# Patient Record
Sex: Male | Born: 1995 | Race: White | Hispanic: No | Marital: Single | Smoking: Never smoker
Health system: Southern US, Community
[De-identification: ages and names within clinical notes are randomized; demographics above are authoritative.]

## PROBLEM LIST (undated history)

## (undated) VITALS — BP 131/78 | HR 97 | Temp 98.2°F | Resp 16 | Ht 71.26 in | Wt 202.8 lb

## (undated) DIAGNOSIS — E669 Obesity, unspecified: Secondary | ICD-10-CM

## (undated) DIAGNOSIS — F329 Major depressive disorder, single episode, unspecified: Secondary | ICD-10-CM

## (undated) DIAGNOSIS — F32A Depression, unspecified: Secondary | ICD-10-CM

## (undated) DIAGNOSIS — R45851 Suicidal ideations: Secondary | ICD-10-CM

## (undated) DIAGNOSIS — E119 Type 2 diabetes mellitus without complications: Secondary | ICD-10-CM

## (undated) DIAGNOSIS — F419 Anxiety disorder, unspecified: Secondary | ICD-10-CM

## (undated) DIAGNOSIS — R51 Headache: Secondary | ICD-10-CM

## (undated) HISTORY — PX: NO PAST SURGERIES: SHX2092

---
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
## 2018-03-22 ENCOUNTER — Other Ambulatory Visit: Payer: Self-pay

## 2018-03-22 ENCOUNTER — Emergency Department (HOSPITAL_COMMUNITY)
Admission: EM | Admit: 2018-03-22 | Discharge: 2018-03-22 | Disposition: A | Discharge status: Home / Self Care | Payer: Medicaid Other | Attending: Emergency Medicine | Admitting: Emergency Medicine

## 2018-03-22 ENCOUNTER — Encounter (HOSPITAL_COMMUNITY): Payer: Self-pay

## 2018-03-22 DIAGNOSIS — R05 Cough: Secondary | ICD-10-CM | POA: Diagnosis present

## 2018-03-22 DIAGNOSIS — E119 Type 2 diabetes mellitus without complications: Secondary | ICD-10-CM | POA: Insufficient documentation

## 2018-03-22 DIAGNOSIS — J069 Acute upper respiratory infection, unspecified: Secondary | ICD-10-CM | POA: Diagnosis not present

## 2018-03-22 DIAGNOSIS — Z79899 Other long term (current) drug therapy: Secondary | ICD-10-CM | POA: Insufficient documentation

## 2018-03-22 DIAGNOSIS — B9789 Other viral agents as the cause of diseases classified elsewhere: Secondary | ICD-10-CM | POA: Insufficient documentation

## 2018-03-22 NOTE — ED Provider Notes (Signed)
[facility]   CSN: [id number] Arrival date & time: 03/22/18  0602    History   Chief Complaint Chief Complaint  Patient presents with  . Cough    HPI Logan Harper is a 23 y.o. male.     The history is provided by the patient.  Cough  Cough characteristics:  Non-productive Severity:  Moderate Onset quality:  Gradual Timing:  Intermittent Progression:  Worsening Chronicity:  New Smoker: no   Relieved by:  Nothing Worsened by:  Nothing Associated symptoms: sinus congestion   Associated symptoms: no chest pain and no fever   PT reports onset of cough and congestion several hours ago.  No fever. No recent travel.  He is a non-smoker Patient reports his mother called ambulance for cough and shortness of breath, and that he began having cough and decided to also be transported by EMS Past Medical History:  Diagnosis Date  . Anxiety   . Depression   . Diabetes mellitus without complication (HCC)   . Headache(784.0)   . Obesity   . Suicidal ideation     Patient Active Problem List   Diagnosis Date Noted  . Conduct disorder, childhood-onset type, severe   . Suicidal ideation   . GAD (generalized anxiety disorder) 09/08/2012  . Fast heart beat 04/27/2012  . MDD (major depressive disorder), recurrent episode, moderate (HCC) 11/21/2011  . Conduct disorder, childhood-onset type 11/21/2011  . ADHD (attention deficit hyperactivity disorder), combined type 11/21/2011    Past Surgical History:  Procedure Laterality Date  . NO PAST SURGERIES          Home Medications    Prior to Admission medications   Medication Sig Start Date End Date Taking? Authorizing Provider  citalopram (CELEXA) 20 MG tablet Take 1 tablet (20 mg total) by mouth at bedtime. 12/08/13   Court Joy, PA-C  levocetirizine (XYZAL) 5 MG tablet Take 5 mg by mouth every evening.     [provider]  OLANZapine (ZYPREXA) 20 MG tablet  Take 1 tablet (20 mg total) by mouth at bedtime. 12/08/13   Court Joy, PA-C  propranolol (INDERAL) 20 MG tablet Take 2 tablets (40 mg total) by mouth 2 (two) times daily. 12/10/13   Court Joy, PA-C    Family History History reviewed. No pertinent family history.  Social History Social History   Tobacco Use  . Smoking status: Never Smoker  Substance Use Topics  . Alcohol use: No  . Drug use: No     Allergies   Patient has no known allergies.   Review of Systems Review of Systems  Constitutional: Negative for fever.  HENT: Positive for congestion.   Respiratory: Positive for cough.   Cardiovascular: Negative for chest pain.  All other systems reviewed and are negative.    Physical Exam Updated Vital Signs BP (!) 159/93 (BP Location: Left Arm)   Pulse (!) 111   Temp 98.5 F (36.9 C) (Oral)   Resp 18   Ht 1.905 m (6\' 3" )   Wt (!) 141.8 kg   SpO2 96%   BMI 39.07 kg/m   Physical Exam CONSTITUTIONAL: Well developed/well nourished using is phone no acute distress HEAD: Normocephalic/atraumatic EYES: EOMI/PERRL ENMT: Mucous membranes moist, uvula midline without erythema or exudate NECK: supple no meningeal signs SPINE/BACK:entire spine nontender CV: S1/S2 noted, no murmurs/rubs/gallops noted LUNGS: Lungs are clear to auscultation bilaterally, no apparent distress ABDOMEN: soft, nontender, no rebound or guarding, bowel sounds noted throughout  abdomen GU:no cva tenderness NEURO: Pt is awake/alert/appropriate, moves all extremitiesx4.  No facial droop.   EXTREMITIES: pulses normal/equal, full ROM, no lower extremity edema or tenderness SKIN: warm, color normal PSYCH: no abnormalities of mood noted, alert and oriented to situation   ED Treatments / Results  Labs (all labs ordered are listed, but only abnormal results are displayed) Labs Reviewed - No data to display  EKG None  Radiology No results found.  Procedures Procedures    Medications  Ordered in ED Medications - No data to display   Initial Impression / Assessment and Plan / ED Course  I have reviewed the triage vital signs and the nursing notes.      Patient reports cough without fever.  Lung sounds are clear.  Will discharge home No indication for imaging or further workup  Final Clinical Impressions(s) / ED Diagnoses   Final diagnoses:  Viral URI with cough    ED Discharge Orders    None       Zadie Rhine, MD 03/22/18 [phone]

## 2018-03-22 NOTE — ED Triage Notes (Signed)
Pt reports cough and chest congestion. Denies fever or travel. Hx DM and anxiety. Ambulatory in no distress.

## 2018-05-11 ENCOUNTER — Encounter (HOSPITAL_COMMUNITY): Payer: Self-pay

## 2018-05-11 ENCOUNTER — Other Ambulatory Visit: Payer: Self-pay

## 2018-05-11 ENCOUNTER — Emergency Department (HOSPITAL_COMMUNITY)
Admission: EM | Admit: 2018-05-11 | Discharge: 2018-05-12 | Disposition: A | Discharge status: Other Acute Inpatient Hospital | Payer: Medicaid Other | Attending: Emergency Medicine | Admitting: Emergency Medicine

## 2018-05-11 DIAGNOSIS — R45851 Suicidal ideations: Secondary | ICD-10-CM | POA: Insufficient documentation

## 2018-05-11 DIAGNOSIS — Z79899 Other long term (current) drug therapy: Secondary | ICD-10-CM | POA: Insufficient documentation

## 2018-05-11 DIAGNOSIS — F29 Unspecified psychosis not due to a substance or known physiological condition: Secondary | ICD-10-CM | POA: Insufficient documentation

## 2018-05-11 DIAGNOSIS — Z03818 Encounter for observation for suspected exposure to other biological agents ruled out: Secondary | ICD-10-CM | POA: Insufficient documentation

## 2018-05-11 LAB — CBC WITH DIFFERENTIAL/PLATELET
Abs Immature Granulocytes: 0.02 10*3/uL (ref 0.00–0.07)
Basophils Absolute: 0 10*3/uL (ref 0.0–0.1)
Basophils Relative: 0 %
Eosinophils Absolute: 0.1 10*3/uL (ref 0.0–0.5)
Eosinophils Relative: 1 %
HCT: 43.5 % (ref 39.0–52.0)
Hemoglobin: 14.4 g/dL (ref 13.0–17.0)
Immature Granulocytes: 0 %
Lymphocytes Relative: 26 %
Lymphs Abs: 2.3 10*3/uL (ref 0.7–4.0)
MCH: 29.1 pg (ref 26.0–34.0)
MCHC: 33.1 g/dL (ref 30.0–36.0)
MCV: 87.9 fL (ref 80.0–100.0)
Monocytes Absolute: 0.8 10*3/uL (ref 0.1–1.0)
Monocytes Relative: 9 %
Neutro Abs: 5.8 10*3/uL (ref 1.7–7.7)
Neutrophils Relative %: 64 %
Platelets: 298 10*3/uL (ref 150–400)
RBC: 4.95 MIL/uL (ref 4.22–5.81)
RDW: 13.5 % (ref 11.5–15.5)
WBC: 9 10*3/uL (ref 4.0–10.5)
nRBC: 0 % (ref 0.0–0.2)

## 2018-05-11 LAB — COMPREHENSIVE METABOLIC PANEL
ALT: 57 U/L — ABNORMAL HIGH (ref 0–44)
AST: 41 U/L (ref 15–41)
Albumin: 4.7 g/dL (ref 3.5–5.0)
Alkaline Phosphatase: 98 U/L (ref 38–126)
Anion gap: 13 (ref 5–15)
BUN: 5 mg/dL — ABNORMAL LOW (ref 6–20)
CO2: 22 mmol/L (ref 22–32)
Calcium: 9.8 mg/dL (ref 8.9–10.3)
Chloride: 101 mmol/L (ref 98–111)
Creatinine, Ser: 1.06 mg/dL (ref 0.61–1.24)
GFR calc Af Amer: >60 mL/min (ref >60)
GFR calc non Af Amer: >60 mL/min (ref >60)
Glucose, Bld: 125 mg/dL — ABNORMAL HIGH (ref 70–99)
Potassium: 3 mmol/L — ABNORMAL LOW (ref 3.5–5.1)
Sodium: 136 mmol/L (ref 135–145)
Total Bilirubin: 0.9 mg/dL (ref 0.3–1.2)
Total Protein: 8.3 g/dL — ABNORMAL HIGH (ref 6.5–8.1)

## 2018-05-11 LAB — ETHANOL: Alcohol, Ethyl (B): <10 mg/dL (ref <10)

## 2018-05-11 LAB — RAPID URINE DRUG SCREEN, HOSP PERFORMED
Amphetamines: NOT DETECTED
Barbiturates: NOT DETECTED
Benzodiazepines: POSITIVE — AB
Cocaine: NOT DETECTED
Opiates: NOT DETECTED
Tetrahydrocannabinol: NOT DETECTED

## 2018-05-11 LAB — ACETAMINOPHEN LEVEL: Acetaminophen (Tylenol), Serum: <10 ug/mL — ABNORMAL LOW (ref 10–30)

## 2018-05-11 LAB — SALICYLATE LEVEL: Salicylate Lvl: <7 mg/dL (ref 2.8–30.0)

## 2018-05-11 LAB — CBG MONITORING, ED: Glucose-Capillary: 106 mg/dL — ABNORMAL HIGH (ref 70–99)

## 2018-05-11 MED ORDER — LORAZEPAM 2 MG/ML IJ SOLN
1.0000 mg | Freq: Once | INTRAMUSCULAR | Status: AC
  Administered 2018-05-11: 10:00:00 1 mg via INTRAVENOUS
  Filled 2018-05-11: qty 1

## 2018-05-11 MED ORDER — ZIPRASIDONE MESYLATE 20 MG IM SOLR
20.0000 mg | Freq: Once | INTRAMUSCULAR | Status: AC
  Administered 2018-05-11: 10:00:00 20 mg via INTRAMUSCULAR
  Filled 2018-05-11: qty 20

## 2018-05-11 MED ORDER — LORATADINE 10 MG PO TABS: 10.0000 mg | ORAL_TABLET | Freq: Every evening | ORAL | Status: DC

## 2018-05-11 MED ORDER — LORAZEPAM 1 MG PO TABS
2.0000 mg | ORAL_TABLET | ORAL | Status: DC | PRN
  Administered 2018-05-11: 2 mg via ORAL
  Filled 2018-05-11: qty 2

## 2018-05-11 MED ORDER — POTASSIUM CHLORIDE CRYS ER 20 MEQ PO TBCR
20.0000 meq | EXTENDED_RELEASE_TABLET | Freq: Every day | ORAL | Status: DC
  Administered 2018-05-11 – 2018-05-12 (×2): 20 meq via ORAL
  Filled 2018-05-11 (×2): qty 1

## 2018-05-11 MED ORDER — MIDAZOLAM HCL 2 MG/2ML IJ SOLN
4.0000 mg | Freq: Once | INTRAMUSCULAR | Status: AC
  Administered 2018-05-11: 12:00:00 4 mg via INTRAVENOUS
  Filled 2018-05-11: qty 4

## 2018-05-11 MED ORDER — OLANZAPINE 5 MG PO TBDP
10.0000 mg | ORAL_TABLET | Freq: Every day | ORAL | Status: DC
  Administered 2018-05-11: 22:00:00 10 mg via ORAL
  Filled 2018-05-11 (×2): qty 2

## 2018-05-11 MED ORDER — ONDANSETRON 4 MG PO TBDP
4.0000 mg | ORAL_TABLET | Freq: Once | ORAL | Status: DC
  Filled 2018-05-11: qty 1

## 2018-05-11 MED ORDER — OLANZAPINE 10 MG IM SOLR
10.0000 mg | Freq: Once | INTRAMUSCULAR | Status: AC
  Administered 2018-05-11: 12:00:00 10 mg via INTRAMUSCULAR
  Filled 2018-05-11: qty 10

## 2018-05-11 MED ORDER — STERILE WATER FOR INJECTION IJ SOLN
INTRAMUSCULAR | Status: AC
  Administered 2018-05-11: 10:00:00
  Filled 2018-05-11: qty 10

## 2018-05-11 MED ORDER — PROPRANOLOL HCL 40 MG PO TABS
40.0000 mg | ORAL_TABLET | Freq: Two times a day (BID) | ORAL | Status: DC
  Administered 2018-05-12 (×2): 40 mg via ORAL
  Filled 2018-05-11 (×2): qty 1

## 2018-05-11 NOTE — ED Provider Notes (Signed)
[facility] Provider Note   CSN: [id number] Arrival date & time: 05/11/18  0909    History   Chief Complaint Chief Complaint  Patient presents with   Suicidal    HPI Logan Harper is a 23 y.o. male.     HPI   23 year old male with a past medical history of anxiety, depression, conduct disorder presents today via EMS for suicidal ideation.  Per EMS the police were called out as patient had suicidal ideations.  I spoke with his mother on the phone that reports that he has a history of aggressive behavior and was recently taken closet pain.  She notes that he had taken this for approximately 5 years and was weaned off of it recently.  She notes that since stopping clozapine his demeanor has changed, he is withdrawn, will not talk to her, will not eat.  She notes that he wanted her to call the police this morning as he was not feeling well.  She also notes that recently he started taking Latuda.  He has not eaten any of his medicines this morning.  She notes he has been in and out of oral health facilities for similar presentations.  She notes that he has been showing aggression since stopping clozapine.  Patient does not want to provide any significant information.  When asked directly if he wants to hurt himself he reports that he does, he reports that he wants to cut himself.  He also reports that he took medication this morning but is uncertain what medication he took.  He denies any physical complaints.  Past Medical History:  Diagnosis Date   Anxiety    Depression    Diabetes mellitus without complication (HCC)    Headache(784.0)    Obesity    Suicidal ideation     Patient Active Problem List   Diagnosis Date Noted   Conduct disorder, childhood-onset type, severe    Suicidal ideation    GAD (generalized anxiety disorder) 09/08/2012   Fast heart beat 04/27/2012   MDD (major depressive disorder), recurrent episode, moderate  (HCC) 11/21/2011   Conduct disorder, childhood-onset type 11/21/2011   ADHD (attention deficit hyperactivity disorder), combined type 11/21/2011    Past Surgical History:  Procedure Laterality Date   NO PAST SURGERIES          Home Medications    Prior to Admission medications   Medication Sig Start Date End Date Taking? Authorizing Provider  citalopram (CELEXA) 20 MG tablet Take 1 tablet (20 mg total) by mouth at bedtime. 12/08/13   Court JoyKober, Charles E, PA-C  levocetirizine (XYZAL) 5 MG tablet Take 5 mg by mouth every evening.     [provider]  OLANZapine (ZYPREXA) 20 MG tablet Take 1 tablet (20 mg total) by mouth at bedtime. 12/08/13   Court JoyKober, Charles E, PA-C  propranolol (INDERAL) 20 MG tablet Take 2 tablets (40 mg total) by mouth 2 (two) times daily. 12/10/13   Court JoyKober, Charles E, PA-C    Family History History reviewed. No pertinent family history.  Social History Social History   Tobacco Use   Smoking status: Never Smoker   Smokeless tobacco: Never Used  Substance Use Topics   Alcohol use: No   Drug use: No     Allergies   Patient has no known allergies.   Review of Systems Review of Systems  All other systems reviewed and are negative.    Physical Exam Updated Vital Signs BP 104/69  Pulse 82    Temp 98.3 F (36.8 C) (Oral)    Resp 13    SpO2 94%   Physical Exam Vitals signs and nursing note reviewed.  Constitutional:      Appearance: He is well-developed.  HENT:     Head: Normocephalic and atraumatic.  Eyes:     General: No scleral icterus.       Right eye: No discharge.        Left eye: No discharge.     Conjunctiva/sclera: Conjunctivae normal.     Pupils: Pupils are equal, round, and reactive to light.  Neck:     Musculoskeletal: Normal range of motion.     Vascular: No JVD.     Trachea: No tracheal deviation.  Pulmonary:     Effort: Pulmonary effort is normal.     Breath sounds: No stridor.  Neurological:     Mental  Status: He is alert.     Coordination: Coordination normal.  Psychiatric:        Behavior: Behavior normal.        Thought Content: Thought content normal.        Judgment: Judgment normal.      ED Treatments / Results  Labs (all labs ordered are listed, but only abnormal results are displayed) Labs Reviewed  COMPREHENSIVE METABOLIC PANEL - Abnormal; Notable for the following components:      Result Value   Potassium 3.0 (*)    Glucose, Bld 125 (*)    BUN 5 (*)    Total Protein 8.3 (*)    ALT 57 (*)    All other components within normal limits  ACETAMINOPHEN LEVEL - Abnormal; Notable for the following components:   Acetaminophen (Tylenol), Serum <10 (*)    All other components within normal limits  CBG MONITORING, ED - Abnormal; Notable for the following components:   Glucose-Capillary 106 (*)    All other components within normal limits  SALICYLATE LEVEL  ETHANOL  CBC WITH DIFFERENTIAL/PLATELET  RAPID URINE DRUG SCREEN, HOSP PERFORMED  LACTIC ACID, PLASMA  LACTIC ACID, PLASMA    EKG EKG Interpretation  Date/Time:  Monday May 11 2018 11:29:27 EDT Ventricular Rate:  89 PR Interval:    QRS Duration: 113 QT Interval:  398 QTC Calculation: 485 R Axis:   61 Text Interpretation:  Sinus rhythm Borderline intraventricular conduction delay Borderline prolonged QT interval When compared to prior, no significant changes seen.  No STEMI Confirmed by Theda Belfast ([id number]) on 05/11/2018 11:56:16 AM   Radiology No results found.  Procedures Procedures (including critical care time)  CRITICAL CARE Performed by: Kelle Darting Leialoha Hanna   Total critical care time: 35 minutes  Critical care time was exclusive of separately billable procedures and treating other patients.  Critical care was necessary to treat or prevent imminent or life-threatening deterioration.  Critical care was time spent personally by me on the following activities: development of treatment plan with  patient and/or surrogate as well as nursing, discussions with consultants, evaluation of patient's response to treatment, examination of patient, obtaining history from patient or surrogate, ordering and performing treatments and interventions, ordering and review of laboratory studies, ordering and review of radiographic studies, pulse oximetry and re-evaluation of patient's condition.  Medications Ordered in ED Medications  ondansetron (ZOFRAN-ODT) disintegrating tablet 4 mg (4 mg Oral Not Given 05/11/18 0947)  OLANZapine zydis (ZYPREXA) disintegrating tablet 10 mg (10 mg Oral Refused 05/11/18 0953)  potassium chloride SA (K-DUR) CR tablet 20 mEq (has no  administration in time range)  LORazepam (ATIVAN) injection 1 mg (1 mg Intravenous Given 05/11/18 1010)  ziprasidone (GEODON) injection 20 mg (20 mg Intramuscular Given 05/11/18 1010)  sterile water (preservative free) injection (  Given 05/11/18 1012)  OLANZapine (ZYPREXA) injection 10 mg (10 mg Intramuscular Given 05/11/18 1130)  midazolam (VERSED) injection 4 mg (4 mg Intravenous Given 05/11/18 1132)     Initial Impression / Assessment and Plan / ED Course  I have reviewed the triage vital signs and the nursing notes.  Pertinent labs & imaging results that were available during my care of the patient were reviewed by me and considered in my medical decision making (see chart for details).        23 year old male here with suicidal ideation and agitation.  He was brought via EMS in handcuffs.  He was attempting to chew off his armband.  When placed in the room he was grabbing spray bottles and spraying them, rolling on the floor shaking the bed.  He is hiding in corners will not follow the directions and cannot be redirected.  He appears acutely agitated is pacing in the room.  Given patient's past medical history, his recent agitation, his obvious agitation in the room I do not feel it is safe for him or those taking care of him at this time.  I had  several stations with him which did not help alleviate any his anxiety or agitation.  He was given olanzapine but spit it out.  Patient will be given Geodon and Ativan to ensure his and the safety of those around him.  Patient is refusing labs or EKG at this time.  Patient was given the above medications which had no effect on him.  He had periods of being calm followed by extreme agitation running at staff and trying to go down the hallway.  Given his ongoing agitation and danger to staff members patient was physically restrained and given additional medications including olanzapine and Versed.  Patient will be monitored closely.  Patient continues to be agitated and trying to get out of the bed he is managed to slide his body off the bed.  No injuries from this.   3:29 PM resting comfortably in exam bed in no acute distress-continue restraints until he wakes up and is able to be reassessed extremities warm and well-perfused cap refill intact.  Nursing spoke with poison control recommends monitoring the patient for 12 hours given unknown ingestion.  No repeat labs necessary per nursing staff.  I have very low suspicion patient did ingest but will continue to monitor.  3:29 PM patient resting comfortably in exam bed sleeping.  Restraints checked, extremities warm well perfused patient in no acute distress.  Care will be signed to oncoming provider.  Patient will need to be monitored until approximately 930 tonight.  If no significant changes he will likely be medically cleared.  Final Clinical Impressions(s) / ED Diagnoses   Final diagnoses:  Suicidal ideation  Psychosis, unspecified psychosis type [facility])    ED Discharge Orders    None       Rosalio Loud 05/11/18 1530    Melene Plan, DO 05/12/18 [phone]

## 2018-05-11 NOTE — ED Notes (Signed)
Still unable to change patient into maroon scrubs safely.  Still agitated, combative, and non cooperative despite multiple sedating medications.

## 2018-05-11 NOTE — ED Notes (Signed)
Removed all wires from the patients room, removed bed from the room d/t patient trying to crawl under the bed and pull off bed railings. Mattress left on floor but he was trying to tear stuff out of the back of the mattress. Mattress removed, call bell remains. Security at bedside.

## 2018-05-11 NOTE — ED Provider Notes (Signed)
Care assumed at change of shift, see previous note for complete H&P. Briefly, 23yo male for psych evaluation, patient reportedly took unknown pills today with SI, recent medication changes. Patient was agitated on arrival was required chemical and physical restraints. Poison Control recommended 12 hour observation for possible ingestion, lactic acid pending. Plan is to monitor patient, if stable for [facility] eval after awake and restraints removed will need psych consult.  Physical Exam  BP 104/69   Pulse 82   Temp 98.3 F (36.8 C) (Oral)   Resp 13   SpO2 94%   Physical Exam 1553 hrs Sleeping, respirations even and unlabored, sitter at bedside. ED Course/Procedures     Procedures  MDM  2013hrs patient is awake, redirectable for bad behavior but not aggressive. Patient is medically cleared for [facility] evaluation. PRN Ativan ordered. Disposition pending BH eval.       Jeannie Fend, PA-C 05/11/18 2217    Gerhard Munch, MD 05/11/18 2352

## 2018-05-11 NOTE — BHH Counselor (Signed)
Per Aundra Millet, RN patient is sedated. Unable to assess at this time.

## 2018-05-11 NOTE — ED Notes (Signed)
Filled out and emailed IVC Paperwork, called communications to have paperwork serviced

## 2018-05-11 NOTE — ED Notes (Signed)
Pt would not leave meds in his mouth long enough to dissolve. EDP aware.

## 2018-05-11 NOTE — ED Notes (Signed)
Logan Harper updated on pt status.  Still sleeping after sedating medications given earlier by ER staff. Recommended letting patient sleep before removing restraints and obtaining lab work for safety of patient and staff. Also received call from poison control to update on patient condition.
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
## 2018-05-11 NOTE — ED Triage Notes (Addendum)
[facility]- pt brought in for suicidal ideation. Recently changed depression medications, pt now taking latuda. Pt hard to redirect on arrival. Pt reported to EMS "I would be better off dead."  [phone] [place]

## 2018-05-11 NOTE — ED Notes (Signed)
Per Megan, RN patient is sedated. Unable to assess at this time. 

## 2018-05-11 NOTE — ED Notes (Signed)
Patient stated he wanted something for nausea medication given Zofran ODT, patient took it out of his mouth and crushed it in his hand.

## 2018-05-11 NOTE — ED Notes (Signed)
Gloves, BP cuffs, wall equipment, call light & canisters removed from room. Pt pushing distress alarm multiple times. Cabinet doors were secured using zip ties, but broken off and removed by pt. [employer], Security and sitter at bedside.

## 2018-05-11 NOTE — ED Notes (Signed)
IVC and affidavit paperwork completed by [employer].

## 2018-05-11 NOTE — ED Notes (Signed)
Pt being very cooperative at this time. Willing to do vitals, cbg, and blood draw. Phlebotomy aware.

## 2018-05-11 NOTE — ED Notes (Signed)
Pt is becoming very agitated, continuing asking for the nurse saying "I am going to throw up blood, is there medication for that". Pt also continues to take off monitoring equipment. Pt states " I don't want them on and you cant make them". Pt threw drink on the floor and continues to become more agitated asking for the nurse. Pt refuses to listen to this sitter.

## 2018-05-12 ENCOUNTER — Other Ambulatory Visit: Payer: Self-pay

## 2018-05-12 ENCOUNTER — Inpatient Hospital Stay (HOSPITAL_COMMUNITY)
Admission: AD | Admit: 2018-05-12 | Discharge: 2018-05-18 | DRG: 885 | Disposition: A | Discharge status: Home / Self Care | Payer: Medicaid Other | Source: Intra-hospital | Attending: Psychiatry | Admitting: Psychiatry

## 2018-05-12 ENCOUNTER — Encounter (HOSPITAL_COMMUNITY): Payer: Self-pay | Admitting: *Deleted

## 2018-05-12 DIAGNOSIS — G4700 Insomnia, unspecified: Secondary | ICD-10-CM | POA: Diagnosis present

## 2018-05-12 DIAGNOSIS — E876 Hypokalemia: Secondary | ICD-10-CM | POA: Diagnosis present

## 2018-05-12 DIAGNOSIS — K219 Gastro-esophageal reflux disease without esophagitis: Secondary | ICD-10-CM | POA: Diagnosis present

## 2018-05-12 DIAGNOSIS — F333 Major depressive disorder, recurrent, severe with psychotic symptoms: Principal | ICD-10-CM | POA: Diagnosis present

## 2018-05-12 DIAGNOSIS — F419 Anxiety disorder, unspecified: Secondary | ICD-10-CM | POA: Diagnosis present

## 2018-05-12 DIAGNOSIS — F609 Personality disorder, unspecified: Secondary | ICD-10-CM | POA: Diagnosis present

## 2018-05-12 DIAGNOSIS — D649 Anemia, unspecified: Secondary | ICD-10-CM | POA: Diagnosis present

## 2018-05-12 LAB — SARS CORONAVIRUS 2 BY RT PCR (HOSPITAL ORDER, PERFORMED IN ~~LOC~~ HOSPITAL LAB): SARS Coronavirus 2: NEGATIVE

## 2018-05-12 MED ORDER — OLANZAPINE 10 MG PO TBDP
10.0000 mg | ORAL_TABLET | Freq: Every day | ORAL | Status: DC
  Administered 2018-05-12: 10 mg via ORAL
  Filled 2018-05-12 (×3): qty 1

## 2018-05-12 MED ORDER — LORAZEPAM 2 MG/ML IJ SOLN
2.0000 mg | Freq: Once | INTRAMUSCULAR | Status: AC
  Administered 2018-05-12: 01:00:00 2 mg via INTRAMUSCULAR
  Filled 2018-05-12: qty 1

## 2018-05-12 MED ORDER — HALOPERIDOL LACTATE 5 MG/ML IJ SOLN
5.0000 mg | Freq: Once | INTRAMUSCULAR | Status: AC
  Administered 2018-05-12: 02:00:00 5 mg via INTRAMUSCULAR
  Filled 2018-05-12: qty 1

## 2018-05-12 MED ORDER — HALOPERIDOL LACTATE 5 MG/ML IJ SOLN
5.0000 mg | Freq: Once | INTRAMUSCULAR | Status: AC
  Administered 2018-05-12: 14:00:00 5 mg via INTRAMUSCULAR
  Filled 2018-05-12: qty 1

## 2018-05-12 MED ORDER — STERILE WATER FOR INJECTION IJ SOLN
INTRAMUSCULAR | Status: AC
  Administered 2018-05-12: 10 mL
  Filled 2018-05-12: qty 10

## 2018-05-12 MED ORDER — ZIPRASIDONE MESYLATE 20 MG IM SOLR
20.0000 mg | Freq: Once | INTRAMUSCULAR | Status: AC
  Administered 2018-05-12: 20 mg via INTRAMUSCULAR
  Filled 2018-05-12: qty 20

## 2018-05-12 MED ORDER — LORAZEPAM 2 MG/ML IJ SOLN
2.0000 mg | Freq: Once | INTRAMUSCULAR | Status: AC
  Administered 2018-05-12: 14:00:00 2 mg via INTRAMUSCULAR
  Filled 2018-05-12: qty 1

## 2018-05-12 MED ORDER — PROPRANOLOL HCL 40 MG PO TABS
40.0000 mg | ORAL_TABLET | Freq: Two times a day (BID) | ORAL | Status: DC
  Administered 2018-05-12 – 2018-05-18 (×12): 40 mg via ORAL
  Filled 2018-05-12 (×11): qty 1
  Filled 2018-05-12: qty 4
  Filled 2018-05-12 (×2): qty 1
  Filled 2018-05-12: qty 4
  Filled 2018-05-12: qty 1

## 2018-05-12 MED ORDER — POTASSIUM CHLORIDE CRYS ER 20 MEQ PO TBCR
20.0000 meq | EXTENDED_RELEASE_TABLET | Freq: Every day | ORAL | Status: DC
  Administered 2018-05-13 – 2018-05-18 (×6): 20 meq via ORAL
  Filled 2018-05-12 (×9): qty 1

## 2018-05-12 MED ORDER — DIPHENHYDRAMINE HCL 50 MG/ML IJ SOLN
50.0000 mg | Freq: Once | INTRAMUSCULAR | Status: AC
  Administered 2018-05-12: 50 mg via INTRAMUSCULAR
  Filled 2018-05-12: qty 1

## 2018-05-12 MED ORDER — LORATADINE 10 MG PO TABS
10.0000 mg | ORAL_TABLET | Freq: Every evening | ORAL | Status: DC
  Administered 2018-05-12 – 2018-05-17 (×6): 10 mg via ORAL
  Filled 2018-05-12 (×9): qty 1

## 2018-05-12 MED ORDER — HALOPERIDOL LACTATE 5 MG/ML IJ SOLN
5.0000 mg | Freq: Once | INTRAMUSCULAR | Status: AC
  Administered 2018-05-12: 01:00:00 5 mg via INTRAMUSCULAR
  Filled 2018-05-12: qty 1

## 2018-05-12 NOTE — ED Notes (Signed)
Out of cuffs but continues to not follow directions, pulled the emergency button for a second time, defiant, talking back to officers, cussing. Dr Preston Fleeting called to come look at him, staff would like to restrain him since medications alone are not helping him to gain control.
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
## 2018-05-12 NOTE — ED Notes (Signed)
Quieting,police have removed handcuffs, he is resting on the floor still awake and angry. Declined blanket but it was left next to him if he changes his mind.

## 2018-05-12 NOTE — ED Notes (Signed)
Dr Preston Fleeting assessed patient and ordered additional IM meds which were given. At this time he is not presenting with any behavior indicating need for restraint. Not currently aggressive or threatening and not trying to hurt self. He is tearful saying he feels sad.  Used bathroom and when he didn't come out in what seemed like a reasonable time opened door and he is lying on the bathroom floor. Officers had to help remove him and return him to his room.

## 2018-05-12 NOTE — ED Notes (Signed)
Patient provided coloring book and crayons per his request

## 2018-05-12 NOTE — ED Notes (Signed)
Pt continually coming in and out of room/bathroom. Pt continues to lay on floor. Pt also is pulling the bathroom call light and emergency button in room. Pt can be directed back however continues to do the same thing over and over again.

## 2018-05-12 NOTE — ED Notes (Signed)
Pt awake at this time and ambulatory to restroom.  Pt reports continued thoughts of suicide without plan.  Pt denies HI.  Pt denies pain.  NAD noted.

## 2018-05-12 NOTE — Progress Notes (Signed)
Patient is currently asleep after being given IM meds. [facility] to review for possible admission once patient wakes up.Disposition CSW will continue to follow for placement.  Timmothy Euler. Kaylyn Lim, MSW, LCSW Disposition Clinical Social Work [phone] (cell) [phone] (office)

## 2018-05-12 NOTE — ED Notes (Signed)
Released from R wrist and L ankle due to his calming of his behavior and he is asleep, not responding to his name being called. If remains calm and asleep will release the other two limbs.

## 2018-05-12 NOTE — ED Notes (Signed)
Patient calling his mother.

## 2018-05-12 NOTE — ED Notes (Signed)
Dinner tray ordered.

## 2018-05-12 NOTE — ED Notes (Signed)
Patient agreeable to take medications so that he will feel less anxious, patient remains pleasant and cooperative but still appears slightly anxious and restless, unable to sit still and fidgeting his with hands

## 2018-05-12 NOTE — ED Notes (Signed)
Breakfast tray ordered 

## 2018-05-12 NOTE — Progress Notes (Signed)
The patient was found laying on his bathroom floor at approximately 1955. He was uncooperative with the monitoring of his vital signs in that he would not lift his arm. Patient would not allow this author to check his temperature or his pulse oximetry initially. In addition, he refused to get off of the floor despite being offered assistance by this author. Within a few minutes, the patient was found standing and walking around in his bedroom without staff assistance. The above mentioned events were reported off to his nurse.

## 2018-05-12 NOTE — ED Notes (Signed)
Released from remaining two restraints, no longer in any restraints. Sleeping and does not respond to his name being called when writer standing at the foot of his bed. Will continue to monitor for safety, sitter at bedside.

## 2018-05-12 NOTE — ED Notes (Signed)
Patient's mother called to get an update on patient. Patient's mother inquired about if patient was receiving all of his routine medications and was concerned that some of his daily medications were not ordered. This RN discussed with patient's mother that I was unsure of the reason all meds were not ordered but that I would speak with the provider to find out and will call her with an update regarding patient's meds once I know more.

## 2018-05-12 NOTE — ED Notes (Signed)
Pt still in bed and cooperative at this time.

## 2018-05-12 NOTE — ED Notes (Signed)
Nira Conn, NP, patient meets inpatient criteria. TTS to seek placement. Fannie Knee, RN, informed of disposition.

## 2018-05-12 NOTE — ED Notes (Signed)
[employer] with transport request for patient by [employer] to [facility] the patient is IVC'd.

## 2018-05-12 NOTE — ED Notes (Signed)
Patient cooperative, still unable to sit still, pacing around room then will lie on the floor. Patient easily redirected at this time.

## 2018-05-12 NOTE — ED Notes (Signed)
[employer] at bedside, pt is cooperative and agreeable to transport. Documentation neccessary for [facility] given to the officer. Pt's mother notified of transport by the Clinical research associate.

## 2018-05-12 NOTE — ED Notes (Signed)
Transferred from ED to room 48, moved him to 53 minutes after arriving because he is so disruptive agitating male patient in 63. Lying on the floor singing songs with provocative lyrics, being verbally disrespectful. Tried to give him po of Inderal ordered but refused and took it and then put it on the floor. Continues to cuss, and not follow officers directions. Dr Preston Fleeting called for additional orders to help him gain control. Order given for Geodon and given to him with limited resistance but security and officers assisted by holding his arm still which patient insisted on taking the shot in. Loudly insisted I give him more "shots so many to kill him." He asked once he was given the injection if he could keep the needle. Attention seeking, rude, childish, demanding. Mom called while injection was happening and she spoke with the sitter that has been present with him and said she would call back later this am for status update. Will continue to monitor him for safety and response to medications.

## 2018-05-12 NOTE — ED Notes (Signed)
Has slept the remainder of the shift.

## 2018-05-12 NOTE — ED Notes (Signed)
Pt up and requesting phone call, given permission and going back to room

## 2018-05-12 NOTE — ED Notes (Signed)
Lunch tray ordered 

## 2018-05-12 NOTE — ED Notes (Signed)
Quieting down currently after minutes of yelling loudly, singing songs with cuss words in them, asking for food, asking for a blanket, nothing in his conversation suggests psychosis. Bed removed from room because he was crawling under it. Lying on the floor now, and had been even when he did have a bed in his room. Asked to quiet but said he didn't care about others. Will continue to monitor.

## 2018-05-12 NOTE — ED Notes (Signed)
IVC paperwork faxed at the request of [facility]- to number [phone].

## 2018-05-12 NOTE — ED Notes (Signed)
Patient becoming anxious/restless, stating that he feels "very suicidal" and is requesting medication to help him.

## 2018-05-12 NOTE — ED Notes (Signed)
Patient has been agitated and has refused his medication. He is given a dose of ziprasidone - both for his safety, and safety of the staff.  0110 He continues to be very agitated in spite of ziprasidone. He is given haloperidol and lorazepam.  1:55 AM Still agitated and combative, needs restraints for his safety, and safety of staff. Will give additional haloperidol and diphenhydramine.  0230 He is now resting comfortably.  0500 Sleeping.  CRITICAL CARE Performed by: Dione Booze Total critical care time: 80 minutes Critical care time was exclusive of separately billable procedures and treating other patients. Critical care was necessary to treat or prevent imminent or life-threatening deterioration. Critical care was time spent personally by me on the following activities: development of treatment plan with patient and/or surrogate as well as nursing, discussions with consultants, evaluation of patient's response to treatment, examination of patient, obtaining history from patient or surrogate, ordering and performing treatments and interventions, ordering and review of laboratory studies, ordering and review of radiographic studies, pulse oximetry and re-evaluation of patient's condition.   Dione Booze, MD 05/12/18 [phone]

## 2018-05-12 NOTE — Progress Notes (Signed)
Patient mother French Ana [phone] would like to speak the provider in the morning about patients medications. Has requested that patient does not be prescribed Latuda  or Clozapine states medications does not work for patient. Needs to be on Omeprazole 40 mg daily for IBS takes in the morning. Also requested that patient be prescribed Lexapro 20 mg daily in am for anxiety.

## 2018-05-12 NOTE — ED Notes (Signed)
Breakfast tray here.

## 2018-05-12 NOTE — Progress Notes (Signed)
Pt accepted to  [facility], Bed 506-1 Denzil Magnuson, NP is the accepting provider.  Malvin Johns, MD is the attending provider.  Call report to [phone]  [facility] ED notified.   Pt is IVC PLEASE FAX IVC TO [phone]  Pt may be transported by Law Enforcement Pt scheduled  to arrive at [facility]@17 :00  [facility] T. Carney Bern, MSW, LCSW Disposition Clinical Social Work [phone] (cell) [phone] (office)

## 2018-05-12 NOTE — ED Notes (Signed)
Continues to resist direction, yell, police have him in handcuffs at this time. Dr. Preston Fleeting called again for additional orders, Haldol and Ativan which were given as directed. [employer] and security continue to be with him, he is now being more responsive to male Engineer, materials. Poison control called to check recent vitals and EKG.

## 2018-05-12 NOTE — ED Notes (Signed)
Lunch tray here 

## 2018-05-13 DIAGNOSIS — F333 Major depressive disorder, recurrent, severe with psychotic symptoms: Principal | ICD-10-CM

## 2018-05-13 MED ORDER — ESCITALOPRAM OXALATE 10 MG PO TABS
10.0000 mg | ORAL_TABLET | Freq: Every day | ORAL | Status: DC
  Administered 2018-05-13 – 2018-05-18 (×6): 10 mg via ORAL
  Filled 2018-05-13 (×8): qty 1

## 2018-05-13 MED ORDER — ARIPIPRAZOLE 5 MG PO TABS
5.0000 mg | ORAL_TABLET | Freq: Every day | ORAL | Status: DC
  Administered 2018-05-13 – 2018-05-15 (×3): 5 mg via ORAL
  Filled 2018-05-13 (×5): qty 1

## 2018-05-13 MED ORDER — PANTOPRAZOLE SODIUM 40 MG PO TBEC: 40.0000 mg | DELAYED_RELEASE_TABLET | Freq: Every day | ORAL | Status: DC

## 2018-05-13 MED ORDER — PANTOPRAZOLE SODIUM 40 MG PO TBEC
40.0000 mg | DELAYED_RELEASE_TABLET | Freq: Two times a day (BID) | ORAL | Status: DC
  Administered 2018-05-13 – 2018-05-18 (×10): 40 mg via ORAL
  Filled 2018-05-13 (×13): qty 1

## 2018-05-13 NOTE — [facility] Suicide Risk Assessment (Signed)
[facility] Admission Suicide Risk Assessment   Nursing information obtained from:  pt and mother Demographic factors:  Male, Adolescent or young adult, Unemployed Current Mental Status:  Suicidal ideation indicated by patient Loss Factors:  NA Historical Factors:  Prior suicide attempts, Family history of mental illness or substance abuse Risk Reduction Factors:  Living with another person, especially a relative  Total Time spent with patient: 45 minutes Principal Problem: Chronic volatile behaviors Diagnosis:  Active Problems:   MDD (major depressive disorder), recurrent, severe, with psychosis (HCC)  Subjective Data: Patient admitted and disruptive in the emergency department reporting suicidal thoughts  Continued Clinical Symptoms:  Alcohol Use Disorder Identification Test Final Score (AUDIT): 0 The "Alcohol Use Disorders Identification Test", Guidelines for Use in Primary Care, Second Edition.  World Science writer [facility]). Score between 0-7:  no or low risk or alcohol related problems. Score between 8-15:  moderate risk of alcohol related problems. Score between 16-19:  high risk of alcohol related problems. Score 20 or above:  warrants further diagnostic evaluation for alcohol dependence and treatment.   CLINICAL FACTORS:   Depression:   Aggression    COGNITIVE FEATURES THAT CONTRIBUTE TO RISK:  Loss of executive function    SUICIDE RISK:   Minimal: No identifiable suicidal ideation.  Patients presenting with no risk factors but with morbid ruminations; may be classified as minimal risk based on the severity of the depressive symptoms  PLAN OF CARE: Consider long-acting injectable see admission note  I certify that inpatient services furnished can reasonably be expected to improve the patient's condition.   Malvin Johns, MD 05/13/2018, 10:36 AM

## 2018-05-13 NOTE — Progress Notes (Signed)
Patient was found in floor sitting in bathroom. Patient asked to get out of floor and refused. Patient vitals 98.3-120/75-93-18-97% room air. Prior to being in floor patient had been to nurses station requesting food and interacting with this Clinical research associate. Patient is alert and oriented. No injuries noted. When patient asked what he was doing in the floor he stated he blacked out.

## 2018-05-13 NOTE — Progress Notes (Signed)
Pt currently asleep in bed. Respiration are even and unlabored. Pt in no sign of distress. Will continue to monitor.   

## 2018-05-13 NOTE — Progress Notes (Addendum)
Patient was talking with mom on phone and became upset and went to his room and threw his tray in floor. Patient then came to nurses station and was upset with Polo Riley and verbally threatening her when she explained the doctors had to address his medications. Patient insisting on his mother to be called. This Clinical research associate intervene and with verbal escalation was able to get patient to calm down and did speak with patient mom about his medications and patient was pleased with this. Patient educated on not to come into nurses station and to maintain a six feet distance when speaking with staff and to be appropriate in his behavior. He verbalized understanding and apologized for what he had done.

## 2018-05-13 NOTE — Progress Notes (Signed)
Patient ID: Logan Harper, male   DOB: 08/06/1995, 23 y.o.   MRN: [id number]  [employer] NOVEL CORONAVIRUS (COVID-19) DAILY CHECK-OFF SYMPTOMS - answer yes or no to each - every day NO YES  Have you had a fever in the past 24 hours?  . Fever (Temp > 37.80C / 100F) X   Have you had any of these symptoms in the past 24 hours? . New Cough .  Sore Throat  .  Shortness of Breath .  Difficulty Breathing .  Unexplained Body Aches   X   Have you had any one of these symptoms in the past 24 hours not related to allergies?   . Runny Nose .  Nasal Congestion .  Sneezing   X   If you have had runny nose, nasal congestion, sneezing in the past 24 hours, has it worsened?  X   EXPOSURES - check yes or no X   Have you traveled outside the state in the past 14 days?  X   Have you been in contact with someone with a confirmed diagnosis of COVID-19 or PUI in the past 14 days without wearing appropriate PPE?  X   Have you been living in the same home as a person with confirmed diagnosis of COVID-19 or a PUI (household contact)?    X   Have you been diagnosed with COVID-19?    X              What to do next: Answered NO to all: Answered YES to anything:   Proceed with unit schedule Follow the [facility] Inpatient Flowsheet.

## 2018-05-13 NOTE — [facility] Suicide Risk Assessment (Signed)
[facility] INPATIENT:  Family/Significant Other Suicide Prevention Education  Suicide Prevention Education:  Education Completed; mother, Logan Harper [phone] has been identified by the patient as the family member/significant other with whom the patient will be residing, and identified as the person(s) who will aid the patient in the event of a mental health crisis (suicidal ideations/suicide attempt).  With written consent from the patient, the family member/significant other has been provided the following suicide prevention education, prior to the and/or following the discharge of the patient.  The suicide prevention education provided includes the following:  Suicide risk factors  Suicide prevention and interventions  National Suicide Hotline telephone number  [facility] assessment telephone number  [facility] Emergency Assistance 911  [facility] and/or Residential Mobile Crisis Unit telephone number  Request made of family/significant other to:  Remove weapons (e.g., guns, rifles, knives), all items previously/currently identified as safety concern.    Remove drugs/medications (over-the-counter, prescriptions, illicit drugs), all items previously/currently identified as a safety concern.  The family member/significant other verbalizes understanding of the suicide prevention education information provided.  The family member/significant other agrees to remove the items of safety concern listed above.  Mother confirms that there are no guns or weapons in the home. She verified that patient sees psychiatrist Tamela Oddi and does not have a therapist.  She has no safety concerns regarding the patient discharging and returning to her home. She states her only concerns are "the ones I've already talked to everyone about," regarding medication adjustments.  Logan Harper 05/13/2018, 1:04 PM

## 2018-05-13 NOTE — Progress Notes (Signed)
Patient ID: Logan Harper, male   DOB: March 23, 1995, 22 y.o.   MRN: [id number]  [name] requesting to speak to charge nurse Hydrographic surveyor) on unit. Logan Harper has contacted the unit multiple times requesting to speak to staff and the psychiatrist. Staff has informed Logan Harper the pscyhiatrist is currently rounding on patients but has been given her message. Psychiatrist also informed per [name] was not working but that Celexa was helpful for mood disorder.  [name] offered opportunity to speak to primary nurse but [name] refused. [name] was irritable and began to yell at Clinical research associate. She stated, "how can the emergency room restart his allergy medicine and omperazole but not his celexa?" [name] informed patient would need to be evaluated by the psychiatrist before making medication changes. [name] demanding patient receive Celexa right now. Logan Harper stated, "you are neglecting my son. I should call the news about this. Why has no one called me? I have not been consulted for anything. He is 20 but he is unable to care for himself. I am his mother and I take care of him". Emotional support offered. Logan Harper declines she is [name] legal guardian. [name] informed social work or psychiatrist would be reaching out for collateral information but Clinical research associate could not give a specific time due to staff currently meeting with patients.  [name] became irate and stated, "well then you'll have to ask Logan Harper what meds he'Harper on and he won't be able to tell you. Good luck discharging him because I won't take him back after you mess up his medications". [name] demanding to speak to [facility]. AC Logan Harper notified. Name and number were taken and provided to [facility] for follow up.

## 2018-05-13 NOTE — Progress Notes (Signed)
[employer] NOVEL CORONAVIRUS (COVID-19) DAILY CHECK-OFF SYMPTOMS - answer yes or no to each - every day NO YES  Have you had a fever in the past 24 hours?  . Fever (Temp > 37.80C / 100F) X   Have you had any of these symptoms in the past 24 hours? . New Cough .  Sore Throat  .  Shortness of Breath .  Difficulty Breathing .  Unexplained Body Aches   X   Have you had any one of these symptoms in the past 24 hours not related to allergies?   . Runny Nose .  Nasal Congestion .  Sneezing   X   If you have had runny nose, nasal congestion, sneezing in the past 24 hours, has it worsened?  X   EXPOSURES - check yes or no X   Have you traveled outside the state in the past 14 days?  X   Have you been in contact with someone with a confirmed diagnosis of COVID-19 or PUI in the past 14 days without wearing appropriate PPE?  X   Have you been living in the same home as a person with confirmed diagnosis of COVID-19 or a PUI (household contact)?    X   Have you been diagnosed with COVID-19?    X              What to do next: Answered NO to all: Answered YES to anything:   Proceed with unit schedule Follow the [facility] Inpatient Flowsheet.   

## 2018-05-13 NOTE — Progress Notes (Signed)
Recreation Therapy Notes  INPATIENT RECREATION THERAPY ASSESSMENT  Patient Details Name: Curt Dupler MRN: [id number] DOB: 08/28/1995 Today's Date: 05/13/2018       Information Obtained From: Patient  Able to Participate in Assessment/Interview: Yes  Patient Presentation: Alert  Reason for Admission (Per Patient): Suicide Attempt  Patient Stressors: Other (Comment)(Life in general)  Coping Skills:   Isolation, Self-Injury, TV, Arguments, Aggression, Music, Art, Prayer, Avoidance, Read  Leisure Interests (2+):  Games - Video games, Individual - Other (Comment)(Chill)  Frequency of Recreation/Participation: Other (Comment)(Daily)  Awareness of Community Resources:  Yes  Community Resources:  Restaurants  Current Use: Yes  If no, Barriers?:    Expressed Interest in [employer] Information: No  County of Residence:  [place]  Patient Main Form of Transportation: Car  Patient Strengths:  Strong; Can color  Patient Identified Areas of Improvement:  Life;  Make better decisions  Patient Goal for Hospitalization:  "make myself better, get out of here"  Current SI (including self-harm):  No  Current HI:  No  Current AVH: No  Staff Intervention Plan: Group Attendance, Collaborate with Interdisciplinary Treatment Team  Consent to Intern Participation: N/A    Caroll Rancher, LRT/CTRS  Caroll Rancher A 05/13/2018, 12:51 PM

## 2018-05-13 NOTE — Progress Notes (Signed)
Recreation Therapy Notes  Date: 5.6.20 Time: 1000 Location: 500 Hall Dayroom   Group Topic: Communication, Team Building, Problem Solving  Goal Area(s) Addresses:  Patient will effectively work with peer towards shared goal.  Patient will identify skill used to make activity successful.  Patient will identify how skills used during activity can be used to reach post d/c goals.   Behavioral Response: Engaged  Intervention: STEM Activity   Activity: [employer]. Patients were provided the following materials: 5 drinking straws, 5 rubber bands, 5 paper clips, 2 index cards, 2 drinking cups, and 2 toilet paper rolls. Using the provided materials patients were asked to build a launching mechanisms to launch a ping pong ball approximately 12 feet. Patients were divided into teams of 3-5.   Education: Pharmacist, community, Building control surveyor.   Education Outcome: Acknowledges education/In group clarification offered/Needs additional education.   Clinical Observations/Feedback: Pt was active and engaged throughout activity.  Pt worked well with peers.  Pt expressed using the activity as a way to release anger.  Pt identified music and watching tv as ways for him to release anger.    Caroll Rancher, LRT/CTRS     Caroll Rancher A 05/13/2018 11:53 AM

## 2018-05-13 NOTE — Plan of Care (Signed)
  Problem: Activity: Goal: Interest or engagement in activities will improve Outcome: Progressing   Problem: Coping: Goal: Ability to verbalize frustrations and anger appropriately will improve Outcome: Progressing   D: Pt alert and oriented on the unit. Pt engaging with RN staff and other pts. Pt denies SI/HI, A/VH. Pt participated during unit groups and activities and is cooperative. A: Education, support and encouragement provided, q15 minute safety checks remain in effect. Medications administered per MD orders. R: No reactions/side effects to medicine noted. Pt denies any concerns at this time, and verbally contracts for safety. Pt ambulating on the unit with no issues. Pt remains safe on and off the unit.
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
## 2018-05-13 NOTE — Plan of Care (Signed)
  Problem: Activity: Goal: Sleeping patterns will improve Outcome: Progressing   Problem: Medication: Goal: Compliance with prescribed medication regimen will improve Outcome: Progressing

## 2018-05-13 NOTE — Progress Notes (Signed)
D:Patient is alert, oriented and verbal. Patient is taking medications as prescribed. Patient is interacting with staff and peers. Patient has been in hallway and milieu tonight.  A:Patient provided support and encouragement. Q 15 minute checks in progress and patient remains safe on unit.  R: Patient remains safe on unit. Patient currently in bed resting with eyes closed. No distress noted. Respirations even and non labored. Monitoring continues.

## 2018-05-13 NOTE — Tx Team (Signed)
Interdisciplinary Treatment and Diagnostic Plan Update  05/13/2018 Time of Session: 0908 Ayman Brull MRN: [id number]  Principal Diagnosis: <principal problem not specified>  Secondary Diagnoses: Active Problems:   MDD (major depressive disorder), recurrent, severe, with psychosis ([place])   Current Medications:  Current Facility-Administered Medications  Medication Dose Route Frequency Provider Last Rate Last Dose  . ARIPiprazole (ABILIFY) tablet 5 mg  5 mg Oral Daily Johnn Hai, MD   5 mg at 05/13/18 1149  . escitalopram (LEXAPRO) tablet 10 mg  10 mg Oral Daily Johnn Hai, MD   10 mg at 05/13/18 1150  . loratadine (CLARITIN) tablet 10 mg  10 mg Oral QPM Rankin, Shuvon B, NP   10 mg at 05/12/18 2000  . pantoprazole (PROTONIX) EC tablet 40 mg  40 mg Oral BID Johnn Hai, MD      . potassium chloride SA (K-DUR) CR tablet 20 mEq  20 mEq Oral Daily Rankin, Shuvon B, NP   20 mEq at 05/13/18 0738  . propranolol (INDERAL) tablet 40 mg  40 mg Oral BID Rankin, Shuvon B, NP   40 mg at 05/13/18 4034   PTA Medications: Medications Prior to Admission  Medication Sig Dispense Refill Last Dose  . propranolol (INDERAL) 20 MG tablet Take 2 tablets (40 mg total) by mouth 2 (two) times daily. 60 tablet 2 01/20/2014 at Unknown time    Patient Stressors:    Patient Strengths:    Treatment Modalities: Medication Management, Group therapy, Case management,  1 to 1 session with clinician, Psychoeducation, Recreational therapy.   Physician Treatment Plan for Primary Diagnosis: <principal problem not specified> Long Term Goal(s): Improvement in symptoms so as ready for discharge Improvement in symptoms so as ready for discharge   Short Term Goals: Ability to disclose and discuss suicidal ideas Ability to disclose and discuss suicidal ideas  Medication Management: Evaluate patient's response, side effects, and tolerance of medication regimen.  Therapeutic Interventions: 1 to 1 sessions, Unit  Group sessions and Medication administration.  Evaluation of Outcomes: Not Met  Physician Treatment Plan for Secondary Diagnosis: Active Problems:   MDD (major depressive disorder), recurrent, severe, with psychosis ([place])  Long Term Goal(s): Improvement in symptoms so as ready for discharge Improvement in symptoms so as ready for discharge   Short Term Goals: Ability to disclose and discuss suicidal ideas Ability to disclose and discuss suicidal ideas     Medication Management: Evaluate patient's response, side effects, and tolerance of medication regimen.  Therapeutic Interventions: 1 to 1 sessions, Unit Group sessions and Medication administration.  Evaluation of Outcomes: Not Met   RN Treatment Plan for Primary Diagnosis: <principal problem not specified> Long Term Goal(s): Knowledge of disease and therapeutic regimen to maintain health will improve  Short Term Goals: Ability to identify and develop effective coping behaviors will improve and Compliance with prescribed medications will improve  Medication Management: RN will administer medications as ordered by provider, will assess and evaluate patient's response and provide education to patient for prescribed medication. RN will report any adverse and/or side effects to prescribing provider.  Therapeutic Interventions: 1 on 1 counseling sessions, Psychoeducation, Medication administration, Evaluate responses to treatment, Monitor vital signs and CBGs as ordered, Perform/monitor CIWA, COWS, AIMS and Fall Risk screenings as ordered, Perform wound care treatments as ordered.  Evaluation of Outcomes: Not Met   LCSW Treatment Plan for Primary Diagnosis: <principal problem not specified> Long Term Goal(s): Safe transition to appropriate next level of care at discharge, Engage patient in therapeutic group addressing  interpersonal concerns.  Short Term Goals: Engage patient in aftercare planning with referrals and resources, Increase  social support and Increase skills for wellness and recovery  Therapeutic Interventions: Assess for all discharge needs, 1 to 1 time with Social worker, Explore available resources and support systems, Assess for adequacy in community support network, Educate family and significant other(s) on suicide prevention, Complete Psychosocial Assessment, Interpersonal group therapy.  Evaluation of Outcomes: Not Met   Progress in Treatment: Attending groups: No. Participating in groups: No. Taking medication as prescribed: Yes. Toleration medication: Yes. Family/Significant other contact made: No, will contact:  when given permsission Patient understands diagnosis: No. Discussing patient identified problems/goals with staff: Yes. Medical problems stabilized or resolved: Yes. Denies suicidal/homicidal ideation: Yes. Issues/concerns per patient self-inventory: No. Other: none  New problem(s) identified: No, Describe:  none  New Short Term/Long Term Goal(s):  Patient Goals:  "get back home"  Discharge Plan or Barriers:   Reason for Continuation of Hospitalization: Depression Medication stabilization  Estimated Length of Stay: 3-5 days  Attendees: Patient: Logan Harper 05/13/2018   Physician: Dr. Jake Samples, MD 05/13/2018   Nursing: Boyce Medici, RN 05/13/2018   RN Care Manager: 05/13/2018   Social Worker: Lurline Idol, LCSW 05/13/2018   Recreational Therapist:  05/13/2018   Other:  05/13/2018   Other:  05/13/2018   Other: 05/13/2018     Scribe for Treatment Team: Joanne Chars, [place] 05/13/2018 12:13 PM

## 2018-05-13 NOTE — Progress Notes (Signed)
Psychoeducational Group Note  Date:  05/13/2018 Time:  2153  Group Topic/Focus:  Wrap-Up Group:   The focus of this group is to help patients review their daily goal of treatment and discuss progress on daily workbooks.  Participation Level: Did Not Attend  Participation Quality:  Not Applicable  Affect:  Not Applicable  Cognitive:  Not Applicable  Insight:  Not Applicable  Engagement in Group: Not Applicable  Additional Comments:  The patient did not attend group since he was asleep in his bedroom.   Hazle Coca S 05/13/2018, 9:53 PM

## 2018-05-13 NOTE — H&P (Addendum)
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  He has a history of volatile and violent behaviors leading to multiple hospitalizations. What brought him to the hospital this time was thoughts of killing himself without specific plans on my examination and he states he is not sure why he has these thoughts he "just has them" and gives me very little to work with as far as cognitive-based therapy.  He allows me to talk to his mother who is been trying to reach [place] I phoned her soon as I could after rounds she was very angry about the delay in talking to staff and at first did not want to talk to me stating she was tired of telling people the same thing over and over but she did eventually calm down a little bit and give me information about the patient.  [facility], he been stable for 5 years on clozapine by stable she meant that he had not been violent, but he did not like the way it made him feel and was transition to [place] and that was less successful in this hospitalization she believes is the result.  He is on Lexapro which has helped with depression he has been on histamine blockers for irritable bowel. Currently the patient is alert minimally cooperative eye contact fair denies wanting to harm self while here but states that  thoughts are still in his head but no specific plans denies wanting to harm others, can contract here understands what that means denies auditory and visual hallucinations.   Associated Signs/Symptoms: Depression Symptoms:  depressed mood, (Hypo) Manic Symptoms:  Impulsivity, Anxiety Symptoms:  n/a Psychotic Symptoms:  n/a PTSD Symptoms: NA Total Time spent with patient: 45 minutes  Is the patient at risk to self? Yes.    Has the patient been a risk to self in the past 6 months? Yes.    Has the patient been a risk to self within the distant past? Yes.    Is the patient a risk to others? Yes.    Has the patient been a risk to others in the past 6 months? No.  Has the patient been a risk to others within the distant past? Yes.     Prior Inpatient Therapy:  Numerous admissions and group home treatments prior Outpatient Therapy:  Numerous visits/numerous medication trials  Alcohol Screening: 1. How often do you have a drink containing alcohol?: Never 2. How many drinks containing alcohol do you have on a typical day when you are drinking?: 1 or 2 3. How often do you have six or more drinks on one occasion?: Never AUDIT-C Score: 0 4. How often during the last year have you found that you were not able to stop drinking once you had started?: Never 5. How often during the last year have you failed to do what was normally expected from you becasue of drinking?: Never 6. How often  during the last year have you needed a first drink in the morning to get yourself going after a heavy drinking session?: Never 7. How often during the last year have you had a feeling of guilt of remorse after drinking?: Never 8. How often during the last year have you been unable to remember what happened the night before because you had been drinking?: Never 9. Have you or someone else been injured as a result of your drinking?: No 10. Has a relative or friend or a doctor or another health worker been concerned  about your drinking or suggested you cut down?: No Alcohol Use Disorder Identification Test Final Score (AUDIT): 0 Alcohol Brief Interventions/Follow-up: AUDIT Score <7 follow-up not indicated Substance Abuse History in the last 12 months:  No. Consequences of Substance Abuse: NA Previous Psychotropic Medications: Yes  Psychological Evaluations: No  Past Medical History:  Past Medical History:  Diagnosis Date  . Anxiety   . Depression   . Diabetes mellitus without complication (HCC)   . Headache(784.0)   . Obesity   . Suicidal ideation     Past Surgical History:  Procedure Laterality Date  . NO PAST SURGERIES     Family History: History reviewed. No pertinent family history. Family Psychiatric  History: neg Tobacco Screening:   Social History:  Social History   Substance and Sexual Activity  Alcohol Use No     Social History   Substance and Sexual Activity  Drug Use No    Additional Social History:                           Allergies:  No Known Allergies Lab Results:  Results for orders placed or performed during the hospital encounter of 05/11/18 (from the past 48 hour(s))  CBG monitoring, ED     Status: Abnormal   Collection Time: 05/11/18 10:47 AM  Result Value Ref Range   Glucose-Capillary 106 (H) 70 - 99 mg/dL  Comprehensive metabolic panel     Status: Abnormal   Collection Time: 05/11/18 11:02 AM  Result Value Ref Range   Sodium 136 135 - 145 mmol/L   Potassium 3.0 (L) 3.5 - 5.1 mmol/L   Chloride 101 98 - 111 mmol/L   CO2 22 22 - 32 mmol/L   Glucose, Bld 125 (H) 70 - 99 mg/dL   BUN 5 (L) 6 - 20 mg/dL   Creatinine, Ser 8.29 0.61 - 1.24 mg/dL   Calcium 9.8 8.9 - 56.2 mg/dL   Total Protein 8.3 (H) 6.5 - 8.1 g/dL   Albumin 4.7 3.5 - 5.0 g/dL   AST 41 15 - 41 U/L   ALT 57 (H) 0 - 44 U/L   Alkaline Phosphatase 98 38 - 126 U/L   Total Bilirubin 0.9 0.3 - 1.2 mg/dL   GFR calc non Af Amer >60 >60 mL/min   GFR calc Af Amer >60 >60 mL/min   Anion  gap 13 5 - 15    Comment: Performed at [facility], [address] [place], [place] [address]  Salicylate level     Status: None   Collection Time: 05/11/18 11:02 AM  Result Value Ref Range   Salicylate Lvl <7.0 2.8 - 30.0 mg/dL    Comment: Performed at [facility], [address], [place], [place] [address]  Acetaminophen level     Status: Abnormal   Collection Time: 05/11/18 11:02 AM  Result Value Ref Range  Acetaminophen (Tylenol), Serum <10 (L) 10 - 30 ug/mL    Comment: (NOTE) Therapeutic concentrations vary significantly. A range of 10-30 ug/mL  may be an effective concentration for many patients. However, some  are best treated at concentrations outside of this range. Acetaminophen concentrations >150 ug/mL at 4 hours after ingestion  and >50 ug/mL at 12 hours after ingestion are often associated with  toxic reactions. Performed at [facility], [address] [place], [place] [address]   Ethanol     Status: None   Collection Time: 05/11/18 11:02 AM  Result Value Ref Range   Alcohol, Ethyl (B) <10 <10 mg/dL    Comment: (NOTE) Lowest detectable limit for serum alcohol is 10 mg/dL. For medical purposes only. Performed at [facility], [address] [place], [place] [address]   CBC WITH DIFFERENTIAL     Status: None   Collection Time: 05/11/18 11:02 AM  Result Value Ref Range   WBC 9.0 4.0 - 10.5 K/uL    Comment: WHITE COUNT CONFIRMED ON SMEAR   RBC 4.95 4.22 - 5.81 MIL/uL   Hemoglobin 14.4 13.0 - 17.0 g/dL   HCT 91.443.5 78.239.0 - 95.652.0 %   MCV 87.9 80.0 - 100.0 fL   MCH 29.1 26.0 - 34.0 pg   MCHC 33.1 30.0 - 36.0 g/dL   RDW 21.313.5 08.611.5 - 57.815.5 %   Platelets 298 150 - 400 K/uL   nRBC 0.0 0.0 - 0.2 %   Neutrophils Relative % 64 %   Neutro Abs 5.8 1.7 - 7.7 K/uL   Lymphocytes Relative 26 %   Lymphs Abs 2.3 0.7 - 4.0 K/uL   Monocytes Relative 9 %   Monocytes Absolute 0.8 0.1 - 1.0 K/uL   Eosinophils Relative 1 %   Eosinophils Absolute 0.1 0.0 -  0.5 K/uL   Basophils Relative 0 %   Basophils Absolute 0.0 0.0 - 0.1 K/uL   Immature Granulocytes 0 %   Abs Immature Granulocytes 0.02 0.00 - 0.07 K/uL    Comment: Performed at [facility], [address] [place], [place] [address]  Urine rapid drug screen (hosp performed)     Status: Abnormal   Collection Time: 05/11/18  8:53 PM  Result Value Ref Range   Opiates NONE DETECTED NONE DETECTED   Cocaine NONE DETECTED NONE DETECTED   Benzodiazepines POSITIVE (A) NONE DETECTED   Amphetamines NONE DETECTED NONE DETECTED   Tetrahydrocannabinol NONE DETECTED NONE DETECTED   Barbiturates NONE DETECTED NONE DETECTED    Comment: (NOTE) DRUG SCREEN FOR MEDICAL PURPOSES ONLY.  IF CONFIRMATION IS NEEDED FOR ANY PURPOSE, NOTIFY LAB WITHIN 5 DAYS. LOWEST DETECTABLE LIMITS FOR URINE DRUG SCREEN Drug Class                     Cutoff (ng/mL) Amphetamine and metabolites    1000 Barbiturate and metabolites    200 Benzodiazepine                 200 Tricyclics and metabolites     300 Opiates and metabolites        300 Cocaine and metabolites        300 THC                            50 Performed at [facility], [address] [place], [place] [address]   SARS Coronavirus 2 [facility](Hospital order, Performed in  [facility] hospital lab)     Status: None   Collection Time: 05/12/18  4:01 PM  Result Value Ref Range   SARS Coronavirus 2 NEGATIVE NEGATIVE    Comment: (NOTE) If result is NEGATIVE SARS-CoV-2 target nucleic acids are NOT DETECTED. The SARS-CoV-2 RNA is generally detectable in upper and lower  respiratory specimens during the acute phase of infection. The lowest  concentration of SARS-CoV-2 viral copies this assay can detect is 250  copies / mL. A negative result does not preclude SARS-CoV-2 infection  and should not be used as the sole basis for treatment or other  patient management decisions.  A negative result may occur with  improper specimen collection / handling,  submission of specimen other  than nasopharyngeal swab, presence of viral mutation(s) within the  areas targeted by this assay, and inadequate number of viral copies  (<250 copies / mL). A negative result must be combined with clinical  observations, patient history, and epidemiological information. If result is POSITIVE SARS-CoV-2 target nucleic acids are DETECTED. The SARS-CoV-2 RNA is generally detectable in upper and lower  respiratory specimens dur ing the acute phase of infection.  Positive  results are indicative of active infection with SARS-CoV-2.  Clinical  correlation with patient history and other diagnostic information is  necessary to determine patient infection status.  Positive results do  not rule out bacterial infection or co-infection with other viruses. If result is PRESUMPTIVE POSTIVE SARS-CoV-2 nucleic acids MAY BE PRESENT.   A presumptive positive result was obtained on the submitted specimen  and confirmed on repeat testing.  While 2019 novel coronavirus  (SARS-CoV-2) nucleic acids may be present in the submitted sample  additional confirmatory testing may be necessary for epidemiological  and / or clinical management purposes  to differentiate between  SARS-CoV-2 and other Sarbecovirus currently known to infect humans.  If clinically indicated additional testing with an alternate test  methodology [phone]) is advised. The SARS-CoV-2 RNA is generally  detectable in upper and lower respiratory sp ecimens during the acute  phase of infection. The expected result is Negative. Fact Sheet for Patients:  BoilerBrush.com.cy Fact Sheet for Healthcare Providers: https://pope.com/ This test is not yet approved or cleared by the [place] FDA and has been authorized for detection and/or diagnosis of SARS-CoV-2 by FDA under an Emergency Use Authorization (EUA).  This EUA will remain in effect (meaning this test can be  used) for the duration of the COVID-19 declaration under Section 564(b)(1) of the Act, 21 U.S.C. section 360bbb-3(b)(1), unless the authorization is terminated or revoked sooner. Performed at [facility], [address], [place], [place] [address]     Blood Alcohol level:  Lab Results  Component Value Date   [facility] <10 05/11/2018   ETH <5 01/20/2014    Metabolic Disorder Labs:  Lab Results  Component Value Date   HGBA1C 5.7 (H) 09/02/2012   MPG 117 (H) 09/02/2012   MPG 103 11/21/2011   Lab Results  Component Value Date   PROLACTIN 9.1 09/02/2012   Lab Results  Component Value Date   CHOL 177 (H) 09/02/2012   TRIG 136 09/02/2012   HDL 35 09/02/2012   CHOLHDL 5.1 09/02/2012   VLDL 27 09/02/2012   LDLCALC 115 (H) 09/02/2012   LDLCALC 93 11/22/2011    Current Medications: Current Facility-Administered Medications  Medication Dose Route Frequency Provider Last Rate Last Dose  . ARIPiprazole (ABILIFY) tablet 5 mg  5 mg Oral Daily Malvin Johns, MD      .  escitalopram (LEXAPRO) tablet 10 mg  10 mg Oral Daily Malvin Johns, MD      . loratadine (CLARITIN) tablet 10 mg  10 mg Oral QPM Rankin, Shuvon B, NP   10 mg at 05/12/18 2000  . pantoprazole (PROTONIX) EC tablet 40 mg  40 mg Oral BID Malvin Johns, MD      . potassium chloride SA (K-DUR) CR tablet 20 mEq  20 mEq Oral Daily Rankin, Shuvon B, NP   20 mEq at 05/13/18 0738  . propranolol (INDERAL) tablet 40 mg  40 mg Oral BID Rankin, Shuvon B, NP   40 mg at 05/13/18 1914   PTA Medications: Medications Prior to Admission  Medication Sig Dispense Refill Last Dose  . propranolol (INDERAL) 20 MG tablet Take 2 tablets (40 mg total) by mouth 2 (two) times daily. 60 tablet 2 01/20/2014 at Unknown time    Musculoskeletal: Strength & Muscle Tone: within normal limits Gait & Station: normal Patient leans: N/A  Psychiatric Specialty Exam: Physical Exam no EPS no TD vital stable  ROS neurological denies seizures or head trauma  endocrine denies thyroid issues cardiac denies cardiovascular events or issues  Blood pressure 99/82, pulse (!) 105, temperature 98.8 F (37.1 C), temperature source Oral, resp. rate 16, height  (1.88 m), weight 136.1 kg, SpO2 97 %.Body mass index is 38.52 kg/m.  General Appearance: Disheveled and Guarded  Eye Contact:  Minimal  Speech:  Normal Rate  Volume:  Decreased  Mood:  Dysphoric  Affect:  Congruent  Thought Process:  Irrelevant and Descriptions of Associations: Tangential  Orientation:  Full (Time, Place, and Person)  Thought Content:  Illogical and Tangential  Suicidal Thoughts:  Yes.  without intent/plan  Homicidal Thoughts:  No  Memory:  Immediate;   Fair  Judgement:  Fair  Insight:  Fair  Psychomotor Activity:  Normal  Concentration:  Concentration: Poor  Recall:  Poor  Fund of Knowledge:  Poor  Language:  Poor  Akathisia:  Negative  Handed:  Right  AIMS (if indicated):     Assets:  Resilience Social Support  ADL's:  Intact  Cognition:  WNL  Sleep:  Number of Hours: 6.5    Treatment Plan Summary: Daily contact with patient to assess and evaluate symptoms and progress in treatment, Medication management and Plan Admit for stabilization and med trial attempted [facility] and attempted to leave a message for his clinician but I do not think the message system is working  Observation Level/Precautions:  15 minute checks  Laboratory:  UDS  Psychotherapy: Cognitive and behavioral  Medications: Discussed long-acting injectable aripiprazole to reduce episodes of mood disorder  Consultations: None necessary  Discharge Concerns: Long-term safety  Estimated LOS: 7-10  Other: Axis I depression recurrent severe without psychosis chronic behavioral issues chronic personality disorder   Physician Treatment Plan for Primary Diagnosis: <principal problem not specified> Long Term Goal(s): Improvement in symptoms so as ready for discharge  Short Term  Goals: Ability to disclose and discuss suicidal ideas  Physician Treatment Plan for Secondary Diagnosis: Active Problems:   MDD (major depressive disorder), recurrent, severe, with psychosis (HCC)  Long Term Goal(s): Improvement in symptoms so as ready for discharge  Short Term Goals: Ability to disclose and discuss suicidal ideas  I certify that inpatient services furnished can reasonably be expected to improve the patient's condition.    Malvin Johns, MD 5/6/202010:39 AM

## 2018-05-14 MED ORDER — TRAZODONE HCL 100 MG PO TABS
100.0000 mg | ORAL_TABLET | Freq: Every day | ORAL | Status: DC
  Administered 2018-05-14 – 2018-05-18 (×4): 100 mg via ORAL
  Filled 2018-05-14 (×5): qty 1

## 2018-05-14 MED ORDER — HYDROXYZINE HCL 50 MG PO TABS
50.0000 mg | ORAL_TABLET | Freq: Three times a day (TID) | ORAL | Status: DC | PRN
  Administered 2018-05-14 – 2018-05-18 (×6): 50 mg via ORAL
  Filled 2018-05-14 (×5): qty 1

## 2018-05-14 MED ORDER — FERROUS SULFATE 325 (65 FE) MG PO TABS
325.0000 mg | ORAL_TABLET | Freq: Every day | ORAL | Status: DC
  Administered 2018-05-15 – 2018-05-18 (×4): 325 mg via ORAL
  Filled 2018-05-14 (×5): qty 1

## 2018-05-14 MED ORDER — ARIPIPRAZOLE ER 400 MG IM SRER
400.0000 mg | INTRAMUSCULAR | Status: DC
  Administered 2018-05-14: 400 mg via INTRAMUSCULAR

## 2018-05-14 NOTE — Progress Notes (Signed)
Recreation Therapy Notes  Date: 4.7.20 Time: 1000 Location: [address] Hall Day Room  Group Topic: Communication, Team Building, Problem Solving  Goal Area(s) Addresses:  Patient will effectively work with peer towards shared goal.  Patient will identify skill used to make activity successful.  Patient will identify how skills used during activity can be used to reach post d/c goals.   Behavioral Response: None  Intervention: STEM Activity, Music   Activity:  In groups, patients were given 12 pipe cleaners.  Patients were to build a freestanding tower as tall as possible.  During the course of the activity, patients would face budget cuts.  During those budget cuts, patients would have to put one arm behind their back and on the next round of cuts patients wouldn't be allowed to speak to each other.  When more funding in added, patients would be allowed to use both hands and then speak.  Education: Pharmacist, community, Building control surveyor.   Education Outcome: Acknowledges education  Clinical Observations/Feedback:  Pt observed as his peers constructed their tower.  Pt listened and sang along to the music that was played in group.    Caroll Rancher, LRT/CTRS        Caroll Rancher A 05/14/2018 10:42 AM

## 2018-05-14 NOTE — Plan of Care (Signed)
  Problem: Activity: Goal: Interest or engagement in activities will improve Outcome: Progressing  D: Pt alert and oriented on the unit. Pt engaging with RN staff and other pts. Pt denies SI/HI, A/VH. Pt denied any pain. Pt also participated during unit groups and activities. Pt is cooperative. A: Education, support and encouragement provided, q15 minute safety checks remain in effect. Medications administered per MD orders. R: No reactions/side effects to medicine noted. Pt denies any concerns at this time, and verbally contracts for safety. Pt ambulating on the unit with no issues. Pt remains safe on and off the unit.

## 2018-05-14 NOTE — Progress Notes (Signed)

## 2018-05-14 NOTE — Progress Notes (Signed)
Nursing Progress Note: 7p-7a D: Pt currently presents with a pleasant/anxious/limited/hyperverbal/flirtatious affect and behavior. Interacting appropriately with the milieu. Pt reports good sleep during the previous night with current medication regimen.   A: Pt's labs and vitals were monitored throughout the night. Pt supported emotionally and encouraged to express concerns and questions. Pt educated on medications.  R: Pt's safety ensured with 15 minute and environmental checks. Pt currently denies SI, HI, and AVH. Pt verbally contracts to seek staff if SI,HI, or AVH occurs and to consult with staff before acting on any harmful thoughts. Will continue to monitor.

## 2018-05-14 NOTE — [facility] Group Notes (Signed)
Adult Psychoeducational Group Note  Date:  05/14/2018 Time:  8:59 AM  Group Topic/Focus:  Goals Group:   The focus of this group is to help patients establish daily goals to achieve during treatment and discuss how the patient can incorporate goal setting into their daily lives to aide in recovery.  Participation Level:  Active  Participation Quality:  Appropriate  Affect:  Appropriate  Cognitive:  Alert  Insight: Appropriate  Engagement in Group:  Engaged  Modes of Intervention:  Orientation  Additional Comments:   Pt attended and participated in orientation/goals group. Pt goal for today is to work on his anger.   Dellia Nims 05/14/2018, 8:59 AM

## 2018-05-14 NOTE — Progress Notes (Signed)
Patient ID: Logan Harper, male   DOB: 05/10/1995, 22 y.o.   MRN: [id number]  [place] NOVEL CORONAVIRUS (COVID-19) DAILY CHECK-OFF SYMPTOMS - answer yes or no to each - every day NO YES  Have you had a fever in the past 24 hours?  . Fever (Temp > 37.80C / 100F) X   Have you had any of these symptoms in the past 24 hours? . New Cough .  Sore Throat  .  Shortness of Breath .  Difficulty Breathing .  Unexplained Body Aches   X   Have you had any one of these symptoms in the past 24 hours not related to allergies?   . Runny Nose .  Nasal Congestion .  Sneezing   X   If you have had runny nose, nasal congestion, sneezing in the past 24 hours, has it worsened?  X   EXPOSURES - check yes or no X   Have you traveled outside the state in the past 14 days?  X   Have you been in contact with someone with a confirmed diagnosis of COVID-19 or PUI in the past 14 days without wearing appropriate PPE?  X   Have you been living in the same home as a person with confirmed diagnosis of COVID-19 or a PUI (household contact)?    X   Have you been diagnosed with COVID-19?    X              What to do next: Answered NO to all: Answered YES to anything:   Proceed with unit schedule Follow the [facility] Inpatient Flowsheet.   

## 2018-05-14 NOTE — Progress Notes (Signed)
[facility] MD Progress Note  05/14/2018 9:36 AM Logan Harper  MRN:  [id number] Subjective:    Generally calm cooperative and compliant no behavioral issues denies wanting to harm self or others focused on discharge however probably not ready as again he just basically got in yesterday as his first full day but he does tolerate the oral aripiprazole understands risk benefits side effects of the long-acting and wants to take that I think the good news is he is already on a beta-blocker that would protect him from QTC issues and also he is on some iron which will also be protective for NMS overall I think he is in a good spot to take a long-acting I discussed this with his mother she was more cordial and cooperative and agreeable to this treatment plan.  Principal Problem: Reporting suicidal thoughts, volatility chronically at home, past clozapine response Diagnosis: Active Problems:   MDD (major depressive disorder), recurrent, severe, with psychosis (HCC)  Total Time spent with patient: 20 minutes   Past Medical History:  Past Medical History:  Diagnosis Date  . Anxiety   . Depression   . Diabetes mellitus without complication (HCC)   . Headache(784.0)   . Obesity   . Suicidal ideation     Past Surgical History:  Procedure Laterality Date  . NO PAST SURGERIES     Family History: History reviewed. No pertinent family history. Family Psychiatric  History: no new data Social History:  Social History   Substance and Sexual Activity  Alcohol Use No     Social History   Substance and Sexual Activity  Drug Use No    Social History   Socioeconomic History  . Marital status: Single    Spouse name: Not on file  . Number of children: Not on file  . Years of education: Not on file  . Highest education level: Not on file  Occupational History  . 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 Financial resource strain: Not on file  . Food  insecurity:    Worry: Not on file    Inability: Not on file  . Transportation needs:    Medical: Not on file    Non-medical: Not on file  Tobacco Use  . Smoking status: Never Smoker  . Smokeless tobacco: Never Used  Substance and Sexual Activity  . Alcohol use: No  . Drug use: No  . Sexual activity: Never  Lifestyle  . Physical activity:    Days per week: Not on file    Minutes per session: Not on file  . Stress: Not on file  Relationships  . Social connections:    Talks on phone: Not on file    Gets together: Not on file    Attends religious service: Not on file    Active member of club or organization: Not on file    Attends meetings of clubs or organizations: Not on file    Relationship status: Not on file  Other Topics Concern  . Not on file  Social History Narrative  . Not on file   Additional Social History:                         Sleep: Good  Appetite:  Good  Current Medications: Current Facility-Administered Medications  Medication Dose Route Frequency Provider Last Rate Last Dose  . ARIPiprazole (ABILIFY) tablet 5 mg  5 mg Oral Daily Jeannine Kitten,  Arlys John, MD   5 mg at 05/14/18 0736  . escitalopram (LEXAPRO) tablet 10 mg  10 mg Oral Daily Malvin Johns, MD   10 mg at 05/14/18 0736  . loratadine (CLARITIN) tablet 10 mg  10 mg Oral QPM Rankin, Shuvon B, NP   10 mg at 05/13/18 1748  . pantoprazole (PROTONIX) EC tablet 40 mg  40 mg Oral BID Malvin Johns, MD   40 mg at 05/14/18 0736  . potassium chloride SA (K-DUR) CR tablet 20 mEq  20 mEq Oral Daily Rankin, Shuvon B, NP   20 mEq at 05/14/18 0736  . propranolol (INDERAL) tablet 40 mg  40 mg Oral BID Rankin, Shuvon B, NP   40 mg at 05/14/18 1610    Lab Results:  Results for orders placed or performed during the hospital encounter of 05/11/18 (from the past 48 hour(s))  SARS Coronavirus 2 [facility] order, Performed in [facility] hospital lab)     Status: None   Collection Time: 05/12/18  4:01 PM  Result Value  Ref Range   SARS Coronavirus 2 NEGATIVE NEGATIVE    Comment: (NOTE) If result is NEGATIVE SARS-CoV-2 target nucleic acids are NOT DETECTED. The SARS-CoV-2 RNA is generally detectable in upper and lower  respiratory specimens during the acute phase of infection. The lowest  concentration of SARS-CoV-2 viral copies this assay can detect is 250  copies / mL. A negative result does not preclude SARS-CoV-2 infection  and should not be used as the sole basis for treatment or other  patient management decisions.  A negative result may occur with  improper specimen collection / handling, submission of specimen other  than nasopharyngeal swab, presence of viral mutation(s) within the  areas targeted by this assay, and inadequate number of viral copies  (<250 copies / mL). A negative result must be combined with clinical  observations, patient history, and epidemiological information. If result is POSITIVE SARS-CoV-2 target nucleic acids are DETECTED. The SARS-CoV-2 RNA is generally detectable in upper and lower  respiratory specimens dur ing the acute phase of infection.  Positive  results are indicative of active infection with SARS-CoV-2.  Clinical  correlation with patient history and other diagnostic information is  necessary to determine patient infection status.  Positive results do  not rule out bacterial infection or co-infection with other viruses. If result is PRESUMPTIVE POSTIVE SARS-CoV-2 nucleic acids MAY BE PRESENT.   A presumptive positive result was obtained on the submitted specimen  and confirmed on repeat testing.  While 2019 novel coronavirus  (SARS-CoV-2) nucleic acids may be present in the submitted sample  additional confirmatory testing may be necessary for epidemiological  and / or clinical management purposes  to differentiate between  SARS-CoV-2 and other Sarbecovirus currently known to infect humans.  If clinically indicated additional testing with an alternate  test  methodology [phone]) is advised. The SARS-CoV-2 RNA is generally  detectable in upper and lower respiratory sp ecimens during the acute  phase of infection. The expected result is Negative. Fact Sheet for Patients:  BoilerBrush.com.cy Fact Sheet for Healthcare Providers: https://pope.com/ This test is not yet approved or cleared by the [place] FDA and has been authorized for detection and/or diagnosis of SARS-CoV-2 by FDA under an Emergency Use Authorization (EUA).  This EUA will remain in effect (meaning this test can be used) for the duration of the COVID-19 declaration under Section 564(b)(1) of the Act, 21 U.S.C. section 360bbb-3(b)(1), unless the authorization is terminated or revoked sooner. Performed  at [facility], [address] [place], [place] [address]     Blood Alcohol level:  Lab Results  Component Value Date   [facility] 05/11/2018   ETH <5 01/20/2014    Metabolic Disorder Labs: Lab Results  Component Value Date   HGBA1C 5.7 (H) 09/02/2012   MPG 117 (H) 09/02/2012   MPG 103 11/21/2011   Lab Results  Component Value Date   PROLACTIN 9.1 09/02/2012   Lab Results  Component Value Date   CHOL 177 (H) 09/02/2012   TRIG 136 09/02/2012   HDL 35 09/02/2012   CHOLHDL 5.1 09/02/2012   VLDL 27 09/02/2012   LDLCALC 115 (H) 09/02/2012   LDLCALC 93 11/22/2011    Physical Findings: AIMS: Facial and Oral Movements Muscles of Facial Expression: None, normal Lips and Perioral Area: None, normal Jaw: None, normal Tongue: None, normal,Extremity Movements Upper (arms, wrists, hands, fingers): None, normal Lower (legs, knees, ankles, toes): None, normal, Trunk Movements Neck, shoulders, hips: None, normal, Overall Severity Severity of abnormal movements (highest score from questions above): None, normal Incapacitation due to abnormal movements: None, normal Patient's awareness of abnormal movements  (rate only patient's report): No Awareness, Dental Status Current problems with teeth and/or dentures?: No Does patient usually wear dentures?: No  CIWA:    COWS:     Musculoskeletal: Strength & Muscle Tone: within normal limits Gait & Station: normal Patient leans: N/A  Psychiatric Specialty Exam: Physical Exam  ROS  Blood pressure 129/66, pulse 91, temperature 98.4 F (36.9 C), temperature source Oral, resp. rate 16, height 6\' 2"  (1.88 m), weight 136.1 kg, SpO2 97 %.Body mass index is 38.52 kg/m.  General Appearance: Casual  Eye Contact:  Good  Speech:  Clear and Coherent  Volume:  Normal  Mood:  Euthymic  Affect:  Congruent  Thought Process:  Coherent and Goal Directed  Orientation:  Full (Time, Place, and Person)  Thought Content:  Rumination and Tangential  Suicidal Thoughts:  No  Homicidal Thoughts:  No  Memory:  Immediate;   Fair  Judgement:  Fair  Insight:  Fair  Psychomotor Activity:  Normal  Concentration:  Concentration: Fair  Recall:  F[employer]Fair  Fund of Knowledge:  Fair  Language:  Fair  Akathisia:  Negative  Handed:  Right  AIMS (if indicated):     Assets:  Physical Health Resilience Social Support  ADL's:  Intact  Cognition:  WNL  Sleep:  Number of Hours: 6.75     Treatment Plan Summary: Daily contact with patient to assess and evaluate symptoms and progress in treatment, Medication management and Plan Continue oral aripiprazole add long-acting injectable continue cognitive-based work continue reality-based therapy as well  Malvin JohnsFARAH,BRIAN, MD 05/14/2018, 9:36 AM

## 2018-05-14 NOTE — [facility] Group Notes (Signed)
[facility] LCSW Group Therapy Note  Date/Time: 05/14/18, 1315  Type of Therapy/Topic:  Group Therapy:  Balance in Life  Participation Level:  minimal  Description of Group:    This group will address the concept of balance and how it feels and looks when one is unbalanced. Patients will be encouraged to process areas in their lives that are out of balance, and identify reasons for remaining unbalanced. Facilitators will guide patients utilizing problem- solving interventions to address and correct the stressor making their life unbalanced. Understanding and applying boundaries will be explored and addressed for obtaining  and maintaining a balanced life. Patients will be encouraged to explore ways to assertively make their unbalanced needs known to significant others in their lives, using other group members and facilitator for support and feedback.  Therapeutic Goals: 1. Patient will identify two or more emotions or situations they have that consume much of in their lives. 2. Patient will identify signs/triggers that life has become out of balance:  3. Patient will identify two ways to set boundaries in order to achieve balance in their lives:  4. Patient will demonstrate ability to communicate their needs through discussion and/or role plays  Summary of Patient Progress: Pt came and left group several times and did not appear able to sit for any length of time. Pt did share that his mental health is currently out of balance but was otherwise not involved in group discussion during the short time he was present.           Therapeutic Modalities:   Cognitive Behavioral Therapy Solution-Focused Therapy Assertiveness Training  Daleen Squibb, [place]

## 2018-05-15 MED ORDER — ZIPRASIDONE MESYLATE 20 MG IM SOLR
20.0000 mg | Freq: Once | INTRAMUSCULAR | Status: AC
  Administered 2018-05-15: 20 mg via INTRAMUSCULAR
  Filled 2018-05-15: qty 20

## 2018-05-15 MED ORDER — ZIPRASIDONE MESYLATE 20 MG IM SOLR
INTRAMUSCULAR | Status: AC
  Filled 2018-05-15: qty 20

## 2018-05-15 MED ORDER — LORAZEPAM 2 MG/ML IJ SOLN
INTRAMUSCULAR | Status: AC
  Filled 2018-05-15: qty 2

## 2018-05-15 MED ORDER — GABAPENTIN 600 MG PO TABS
600.0000 mg | ORAL_TABLET | Freq: Three times a day (TID) | ORAL | Status: DC
  Administered 2018-05-15 – 2018-05-18 (×10): 600 mg via ORAL
  Filled 2018-05-15 (×14): qty 1

## 2018-05-15 MED ORDER — LORAZEPAM 2 MG/ML IJ SOLN
4.0000 mg | Freq: Once | INTRAMUSCULAR | Status: AC
  Administered 2018-05-15: 4 mg via INTRAMUSCULAR

## 2018-05-15 NOTE — [facility] Group Notes (Signed)
[facility] Group Notes:  (Nursing/MHT/Case Management/Adjunct)  Date:  05/14/2018  Time:  4:00 PM  Type of Therapy:  Nurse Education  Participation Level:  Did Not Attend     Raylene Miyamoto 05/15/2018, 9:57 AM

## 2018-05-15 NOTE — Progress Notes (Signed)
Dar Note: Patient was verbally aggressive and threatening staff during medication administration.  Putting himself on the floor at the medication window and blocking the the entrance to the nursing station.  Became more aggressive when redirected.  Medication given with poor effect.  Patient went to his room and started punching the wall.  Came out of his room and started banging at the doctor's door.  Demanding to be placed in the seclusion room.  Patient placed in seclusion room with door opened.  Geodon 20 mg and Ativan 4 mg IM given for agitation with good effect.  Routine safety checks maintained every 15 minutes.  Patient is safe on the unit.

## 2018-05-15 NOTE — Progress Notes (Signed)
[facility]  05/15/2018 8:24 AM Logan Harper  MRN:  [id number] Subjective:    Patient was initially generally cordial during the interview but he is been extremely attention seeking and disruptive, lying in the floor at times, barricading himself in his room, harassing other patients, and later in the morning standing in front of the medication with no literally blocking it and refusing to move.  Again a lot of attention seeking and antisocial type behaviors so were simply going to have to order Geodon acutely given the fact he is harassing other patients at this point and may cause an altercation if this continues.  Principal Problem: Severe personality disorder Diagnosis: Active Problems:   MDD (major depressive disorder), recurrent, severe, with psychosis (HCC)  Total Time spent with patient: 20 minutes  Past Medical History:  Past Medical History:  Diagnosis Date  . Anxiety   . Depression   . Diabetes mellitus without complication (HCC)   . Headache(784.0)   . Obesity   . Suicidal ideation     Past Surgical History:  Procedure Laterality Date  . NO PAST SURGERIES     Family History: History reviewed. No pertinent family history. Family Psychiatric  History: neg Social History:  Social History   Substance and Sexual Activity  Alcohol Use No     Social History   Substance and Sexual Activity  Drug Use No    Social History   Socioeconomic History  . Marital status: Single    Spouse name: Not on file  . Number of children: Not on file  . Years of education: Not on file  . Highest education level: Not on file  Occupational History  . 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 Financial resource strain: Not on file  . Food insecurity:    Worry: Not on file    Inability: Not on file  . Transportation needs:    Medical: Not on file    Non-medical: Not on file  Tobacco Use  . Smoking status: Never Smoker  .  Smokeless tobacco: Never Used  Substance and Sexual Activity  . Alcohol use: No  . Drug use: No  . Sexual activity: Never  Lifestyle  . Physical activity:    Days per week: Not on file    Minutes per session: Not on file  . Stress: Not on file  Relationships  . Social connections:    Talks on phone: Not on file    Gets together: Not on file    Attends religious service: Not on file    Active member of club or organization: Not on file    Attends meetings of clubs or organizations: Not on file    Relationship status: Not on file  Other Topics Concern  . Not on file  Social History Narrative  . Not on file   Additional Social History:                         Sleep: Fair  Appetite:  Fair  Current Medications: Current Facility-Administered Medications  Medication Dose Route Frequency Provider Last Rate Last Dose  . ARIPiprazole (ABILIFY) tablet 5 mg  5 mg Oral Daily Malvin JohnsFarah, , MD   5 mg at 05/15/18 0801  . ARIPiprazole ER (ABILIFY MAINTENA) injection 400 mg  400 mg Intramuscular Q28 days Malvin JohnsFarah, , MD   400 mg at 05/14/18 1405  . escitalopram (LEXAPRO)  tablet 10 mg  10 mg Oral Daily Malvin Johns, MD   10 mg at 05/15/18 0802  . ferrous sulfate tablet 325 mg  325 mg Oral Q breakfast Malvin Johns, MD   325 mg at 05/15/18 0801  . hydrOXYzine (ATARAX/VISTARIL) tablet 50 mg  50 mg Oral TID PRN Donell Sievert E, PA-C   50 mg at 05/15/18 0802  . loratadine (CLARITIN) tablet 10 mg  10 mg Oral QPM Rankin, Shuvon B, NP   10 mg at 05/14/18 1801  . pantoprazole (PROTONIX) EC tablet 40 mg  40 mg Oral BID Malvin Johns, MD   40 mg at 05/15/18 0802  . potassium chloride SA (K-DUR) CR tablet 20 mEq  20 mEq Oral Daily Rankin, Shuvon B, NP   20 mEq at 05/15/18 0801  . propranolol (INDERAL) tablet 40 mg  40 mg Oral BID Rankin, Shuvon B, NP   40 mg at 05/15/18 0802  . traZODone (DESYREL) tablet 100 mg  100 mg Oral QHS Donell Sievert E, PA-C   100 mg at 05/14/18 2316  . ziprasidone  (GEODON) injection 20 mg  20 mg Intramuscular Once Malvin Johns, MD        Lab Results: No results found for this or any previous visit (from the past 48 hour(s)).  Blood Alcohol level:  Lab Results  Component Value Date   ETH <10 05/11/2018   ETH <5 01/20/2014    Metabolic Disorder Labs: Lab Results  Component Value Date   HGBA1C 5.7 (H) 09/02/2012   MPG 117 (H) 09/02/2012   MPG 103 11/21/2011   Lab Results  Component Value Date   PROLACTIN 9.1 09/02/2012   Lab Results  Component Value Date   CHOL 177 (H) 09/02/2012   TRIG 136 09/02/2012   HDL 35 09/02/2012   CHOLHDL 5.1 09/02/2012   VLDL 27 09/02/2012   LDLCALC 115 (H) 09/02/2012   LDLCALC 93 11/22/2011    Physical Findings: AIMS: Facial and Oral Movements Muscles of Facial Expression: None, normal Lips and Perioral Area: None, normal Jaw: None, normal Tongue: None, normal,Extremity Movements Upper (arms, wrists, hands, fingers): None, normal Lower (legs, knees, ankles, toes): None, normal, Trunk Movements Neck, shoulders, hips: None, normal, Overall Severity Severity of abnormal movements (highest score from questions above): None, normal Incapacitation due to abnormal movements: None, normal Patient's awareness of abnormal movements (rate only patient's report): No Awareness, Dental Status Current problems with teeth and/or dentures?: No Does patient usually wear dentures?: No  CIWA:    COWS:     Musculoskeletal: Strength & Muscle Tone: within normal limits Gait & Station: normal Patient leans: N/A  Psychiatric Specialty Exam: Physical Exam  ROS  Blood pressure 138/81, pulse 87, temperature 98 F (36.7 C), temperature source Oral, resp. rate 16, height  (1.88 m), weight 136.1 kg, SpO2 97 %.Body mass index is 38.52 kg/m.  General Appearance: Casual  Eye Contact:  Fair  Speech:  Clear and Coherent  Volume:  Normal  Mood:  Angry and Irritable  Affect:  Congruent and Constricted  Thought  Process:  Irrelevant and Descriptions of Associations: Loose  Orientation:  Full (Time, Place, and Person)  Thought Content:  Illogical and Tangential  Suicidal Thoughts:  No  Homicidal Thoughts:  No  Memory:  Immediate;   Poor  Judgement:  Impaired  Insight:  Lacking  Psychomotor Activity:  Normal  Concentration:  Concentration: Poor  Recall:  Poor  Fund of Knowledge:  Poor  Language:  Fair  Akathisia:  Negative  Handed:  Right  AIMS (if indicated):     Assets:  Physical Health Resilience  ADL's:  Intact  Cognition:  WNL  Sleep:  Number of Hours: 4.5     Treatment Plan Summary: Daily contact with patient to assess and evaluate symptoms and progress in treatment, Medication management and Plan Again acutely use Geodon IM but long-term has long-acting injectable of Abilify ordered, discussed with mother.  Malvin Johns, MD 05/15/2018, 8:24 AM

## 2018-05-15 NOTE — Progress Notes (Signed)
Nursing Progress Note: 7p-7a D: Pt currently presents with a anxious/pleasant/childlike affect and behavior. Pt states "I'm sorry for how I acted this morning. I was in a weird place, but I feel better after I woke up." Interacting appropriately with the milieu. Pt reports good sleep during the previous night with current medication regimen. Pt did attend wrap-up group.  A: Pt provided with medications per providers orders. Pt's labs and vitals were monitored throughout the night. Pt supported emotionally and encouraged to express concerns and questions. Pt educated on medications.  R: Pt's safety ensured with 15 minute and environmental checks. Pt currently denies SI, HI, and AVH. Pt verbally contracts to seek staff if SI,HI, or AVH occurs and to consult with staff before acting on any harmful thoughts. Will continue to monitor.

## 2018-05-15 NOTE — Progress Notes (Signed)
Adult Psychoeducational Group Note  Date:  05/15/2018 Time:  11:24 PM  Group Topic/Focus:  Wrap-Up Group:   The focus of this group is to help patients review their daily goal of treatment and discuss progress on daily workbooks.  Participation Level:  Active  Participation Quality:  Appropriate  Affect:  Appropriate  Cognitive:  Appropriate  Insight: Appropriate  Engagement in Group:  Engaged  Modes of Intervention:  Discussion  Additional Comments:  Pt stated his goal for today was to talk with his doctor about discharged and medication issues. Pt stated he was able to accomplished his goal today. Pt stated he did not attend any groups today but planned to attend them all tomorrow. Pt stated positive interaction with staff and peers help improve his day.  Felipa Furnace 05/15/2018, 11:24 PM

## 2018-05-15 NOTE — Progress Notes (Signed)
[place] NOVEL CORONAVIRUS (COVID-19) DAILY CHECK-OFF SYMPTOMS - answer yes or no to each - every day NO YES  Have you had a fever in the past 24 hours?  . Fever (Temp > 37.80C / 100F) X   Have you had any of these symptoms in the past 24 hours? . New Cough .  Sore Throat  .  Shortness of Breath .  Difficulty Breathing .  Unexplained Body Aches   X   Have you had any one of these symptoms in the past 24 hours not related to allergies?   . Runny Nose .  Nasal Congestion .  Sneezing   X   If you have had runny nose, nasal congestion, sneezing in the past 24 hours, has it worsened?  X   EXPOSURES - check yes or no X   Have you traveled outside the state in the past 14 days?  X   Have you been in contact with someone with a confirmed diagnosis of COVID-19 or PUI in the past 14 days without wearing appropriate PPE?  X   Have you been living in the same home as a person with confirmed diagnosis of COVID-19 or a PUI (household contact)?    X   Have you been diagnosed with COVID-19?    X              What to do next: Answered NO to all: Answered YES to anything:   Proceed with unit schedule Follow the [facility] Inpatient Flowsheet.   

## 2018-05-15 NOTE — Progress Notes (Addendum)
Spiritual Care Group facilitated by chaplain Burnis Kingfisher, MDiv, [facility].    Group Description:   Group focused on topic of Hope.  Patients engaged in facilitated dialog around topic, identifying definitions and examples.  Patients engaged in visual explorer exercise, connecting hope to current experience.    Patient Progress:  Logan Harper was in quiet room during group time.   Burnis Kingfisher, MDiv, [facility]

## 2018-05-16 NOTE — Plan of Care (Signed)
Progress note  Pt found in bed; compliant with medication administration. Pt is more animated, less depressed/sad/intrusive/needy, and participating with other pt's in the milieu. Pt denies any physical pain or symptoms. Pt denies si/hi/ah/vh and verbally agrees to approach staff if these become apparent or before harming himself/others while at [facility]. Pt provided support and encouragement. Pt given medication per protocol and standing orders. Q67m safety checks implemented and continued. Pt safe on the unit. Will continue to monitor.   Pt progressing in the following metrics  Problem: Education: Goal: Knowledge of [facility] Education information/materials will improve Outcome: Adequate for Discharge Goal: Emotional status will improve Outcome: Adequate for Discharge Goal: Mental status will improve Outcome: Adequate for Discharge Goal: Verbalization of understanding the information provided will improve Outcome: Adequate for Discharge   Problem: Activity: Goal: Interest or engagement in activities will improve Outcome: Adequate for Discharge Goal: Sleeping patterns will improve Outcome: Adequate for Discharge   Problem: Coping: Goal: Ability to verbalize frustrations and anger appropriately will improve Outcome: Adequate for Discharge Goal: Ability to demonstrate self-control will improve Outcome: Adequate for Discharge   Problem: Health Behavior/Discharge Planning: Goal: Identification of resources available to assist in meeting health care needs will improve Outcome: Adequate for Discharge Goal: Compliance with treatment plan for underlying cause of condition will improve Outcome: Adequate for Discharge   Problem: Physical Regulation: Goal: Ability to maintain clinical measurements within normal limits will improve Outcome: Adequate for Discharge   Problem: Safety: Goal: Periods of time without injury will increase Outcome: Adequate for Discharge   Problem:  Education: Goal: Ability to make informed decisions regarding treatment will improve Outcome: Adequate for Discharge   Problem: Coping: Goal: Coping ability will improve Outcome: Adequate for Discharge   Problem: Health Behavior/Discharge Planning: Goal: Identification of resources available to assist in meeting health care needs will improve Outcome: Adequate for Discharge   Problem: Medication: Goal: Compliance with prescribed medication regimen will improve Outcome: Adequate for Discharge   Problem: Self-Concept: Goal: Ability to disclose and discuss suicidal ideas will improve Outcome: Adequate for Discharge Goal: Will verbalize positive feelings about self Outcome: Adequate for Discharge   Problem: Education: Goal: Ability to verbalize precipitating factors for violent behavior will improve Outcome: Adequate for Discharge   Problem: Coping: Goal: Ability to verbalize frustrations and anger appropriately will improve Outcome: Adequate for Discharge   Problem: Health Behavior/Discharge Planning: Goal: Ability to implement measures to prevent violent behavior in the future will improve Outcome: Adequate for Discharge   Problem: Safety: Goal: Ability to demonstrate self-control will improve Outcome: Adequate for Discharge Goal: Ability to redirect hostility and anger into socially appropriate behaviors will improve Outcome: Adequate for Discharge

## 2018-05-16 NOTE — Progress Notes (Signed)
[facility] MD Progress Note  05/16/2018 1:11 PM Logan Harper  MRN:  [id number] Subjective: Patient is a 23 year old male with a past psychiatric history significant for major depression with psychotic features, as well as personality disorder.  He was admitted on 05/13/2018 secondary to suicidal thoughts, history of volatile and violent behaviors that have led to multiple hospitalizations.  Objective: Patient is seen and examined.  Patient is a 23 year old male with the above-stated past psychiatric history is seen in follow-up.  He stated he is doing better today.  He apologized for his outburst yesterday.  Nursing notes reflect that he had been verbally aggressive and threatening staff during medication administration.  He put himself on the floor at the medication window and block the line.  He had to be redirected.  He received Geodon and Ativan.  He denied any auditory or visual hallucinations today.  He denied any suicidal ideation.  He stated he realized he needed to be here.  He has been compliant with medications today.  His blood pressure is stable at 109/56, heart rate is 81.  He is afebrile.  He only slept 3.75 hours last night.  Review of his laboratories revealed a mildly elevated ALT at 57 and a low potassium at 3.0.  Drug screen on admission was positive for benzodiazepines.  Principal Problem: <principal problem not specified> Diagnosis: Active Problems:   MDD (major depressive disorder), recurrent, severe, with psychosis (HCC)  Total Time spent with patient: 15 minutes  Past Psychiatric History: See admission H&P  Past Medical History:  Past Medical History:  Diagnosis Date  . Anxiety   . Depression   . Diabetes mellitus without complication (HCC)   . Headache(784.0)   . Obesity   . Suicidal ideation     Past Surgical History:  Procedure Laterality Date  . NO PAST SURGERIES     Family History: History reviewed. No pertinent family history. Family Psychiatric  History: See  admission H&P Social History:  Social History   Substance and Sexual Activity  Alcohol Use No     Social History   Substance and Sexual Activity  Drug Use No    Social History   Socioeconomic History  . Marital status: Single    Spouse name: Not on file  . Number of children: Not on file  . Years of education: Not on file  . Highest education level: Not on file  Occupational History  . 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 Financial resource strain: Not on file  . Food insecurity:    Worry: Not on file    Inability: Not on file  . Transportation needs:    Medical: Not on file    Non-medical: Not on file  Tobacco Use  . Smoking status: Never Smoker  . Smokeless tobacco: Never Used  Substance and Sexual Activity  . Alcohol use: No  . Drug use: No  . Sexual activity: Never  Lifestyle  . Physical activity:    Days per week: Not on file    Minutes per session: Not on file  . Stress: Not on file  Relationships  . Social connections:    Talks on phone: Not on file    Gets together: Not on file    Attends religious service: Not on file    Active member of club or organization: Not on file    Attends meetings of clubs or organizations: Not on file  Relationship status: Not on file  Other Topics Concern  . Not on file  Social History Narrative  . Not on file   Additional Social History:                         Sleep: Fair  Appetite:  Good  Current Medications: Current Facility-Administered Medications  Medication Dose Route Frequency Provider Last Rate Last Dose  . ARIPiprazole ER (ABILIFY MAINTENA) injection 400 mg  400 mg Intramuscular Q28 days Malvin JohnsFarah, Brian, MD   400 mg at 05/14/18 1405  . escitalopram (LEXAPRO) tablet 10 mg  10 mg Oral Daily Malvin JohnsFarah, Brian, MD   10 mg at 05/16/18 0730  . ferrous sulfate tablet 325 mg  325 mg Oral Q breakfast Malvin JohnsFarah, Brian, MD   325 mg at 05/16/18 0730  . gabapentin  (NEURONTIN) tablet 600 mg  600 mg Oral TID Malvin JohnsFarah, Brian, MD   600 mg at 05/16/18 1204  . hydrOXYzine (ATARAX/VISTARIL) tablet 50 mg  50 mg Oral TID PRN Donell SievertSimon, Spencer E, PA-C   50 mg at 05/16/18 0403  . loratadine (CLARITIN) tablet 10 mg  10 mg Oral QPM Rankin, Shuvon B, NP   10 mg at 05/15/18 1704  . pantoprazole (PROTONIX) EC tablet 40 mg  40 mg Oral BID Malvin JohnsFarah, Brian, MD   40 mg at 05/16/18 0732  . potassium chloride SA (K-DUR) CR tablet 20 mEq  20 mEq Oral Daily Rankin, Shuvon B, NP   20 mEq at 05/16/18 0729  . propranolol (INDERAL) tablet 40 mg  40 mg Oral BID Rankin, Shuvon B, NP   40 mg at 05/16/18 0730  . traZODone (DESYREL) tablet 100 mg  100 mg Oral QHS Kerry HoughSimon, Spencer E, PA-C   100 mg at 05/15/18 2130    Lab Results: No results found for this or any previous visit (from the past 48 hour(s)).  Blood Alcohol level:  Lab Results  Component Value Date   ETH <10 05/11/2018   ETH <5 01/20/2014    Metabolic Disorder Labs: Lab Results  Component Value Date   HGBA1C 5.7 (H) 09/02/2012   MPG 117 (H) 09/02/2012   MPG 103 11/21/2011   Lab Results  Component Value Date   PROLACTIN 9.1 09/02/2012   Lab Results  Component Value Date   CHOL 177 (H) 09/02/2012   TRIG 136 09/02/2012   HDL 35 09/02/2012   CHOLHDL 5.1 09/02/2012   VLDL 27 09/02/2012   LDLCALC 115 (H) 09/02/2012   LDLCALC 93 11/22/2011    Physical Findings: AIMS: Facial and Oral Movements Muscles of Facial Expression: None, normal Lips and Perioral Area: None, normal Jaw: None, normal Tongue: None, normal,Extremity Movements Upper (arms, wrists, hands, fingers): None, normal Lower (legs, knees, ankles, toes): None, normal, Trunk Movements Neck, shoulders, hips: None, normal, Overall Severity Severity of abnormal movements (highest score from questions above): None, normal Incapacitation due to abnormal movements: None, normal Patient's awareness of abnormal movements (rate only patient's report): No Awareness,  Dental Status Current problems with teeth and/or dentures?: No Does patient usually wear dentures?: No  CIWA:    COWS:     Musculoskeletal: Strength & Muscle Tone: within normal limits Gait & Station: normal Patient leans: N/A  Psychiatric Specialty Exam: Physical Exam  Nursing note and vitals reviewed. Constitutional: He is oriented to person, place, and time. He appears well-developed and well-nourished.  HENT:  Head: Normocephalic and atraumatic.  Respiratory: Effort normal.  Neurological: He is alert  and oriented to person, place, and time.    ROS  Blood pressure (!) 109/56, pulse 81, temperature 97.8 F (36.6 C), temperature source Oral, resp. rate 20, height  (1.88 m), weight 136.1 kg, SpO2 97 %.Body mass index is 38.52 kg/m.  General Appearance: Casual  Eye Contact:  Fair  Speech:  Normal Rate  Volume:  Normal  Mood:  Euthymic  Affect:  Congruent  Thought Process:  Coherent and Descriptions of Associations: Intact  Orientation:  Full (Time, Place, and Person)  Thought Content:  Logical  Suicidal Thoughts:  No  Homicidal Thoughts:  No  Memory:  Immediate;   Fair Recent;   Fair Remote;   Fair  Judgement:  Intact  Insight:  Fair  Psychomotor Activity:  Normal  Concentration:  Concentration: Fair and Attention Span: Fair  Recall:  [employer] of Knowledge:  Fair  Language:  Fair  Akathisia:  Negative  Handed:  Right  AIMS (if indicated):     Assets:  Desire for Improvement Resilience  ADL's:  Intact  Cognition:  WNL  Sleep:  Number of Hours: 3.75     Treatment Plan Summary: Daily contact with patient to assess and evaluate symptoms and progress in treatment, Medication management and Plan : Patient is seen and examined.  Patient is a 23 year old male with the above-stated past psychiatric history is seen in follow-up.   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  Patient is a 23 year old male with the above-stated past psychiatric history is seen in follow-up.  He is doing better today.  Less labile.  He has had no argumentative periods.  No change in his current medications. 1.  Patient received Abilify long-acting injection on 05/14/2018 for psychosis and mood stability. 2.  Continue Lexapro 10 mg p.o. daily for anxiety and depression. 3.  Continue ferrous sulfate 325 mg p.o. daily for anemia. 4.  Continue gabapentin 600 mg p.o. 3 times daily for mood stability and irritability. 5.  Continue Claritin 10 mg p.o. daily for seasonal allergies. 6.  Continue Protonix 40 mg p.o. twice daily for GERD. 7.  Continue potassium chloride 20 mEq p.o. daily for hypokalemia. 8.  Continue propranolol 40 mg p.o. twice daily for tremor, anxiety. 9.  Continue trazodone 100 mg p.o. nightly as needed insomnia. 10.  Disposition planning-in progress.  Antonieta Pert, MD 05/16/2018, 1:11 PM

## 2018-05-16 NOTE — Progress Notes (Signed)
Writer has observed patient lying in bed asleep. He did not wake up to take his hs medication. He has  Restoril ordered but since patient has been asleep since shift change writer let him continue to rest. Safety maintained on unit with 15 min checks.

## 2018-05-16 NOTE — [facility] Group Notes (Signed)
[facility] LCSW Group Therapy Note  05/16/2018  10:00-11:00AM  Type of Therapy and Topic:  Group Therapy - Accepting We Are All Damaged People  Participation Level:  Active   Description of Group:  Patients in this group were asked to share whether they feel that they are "damaged" and explain their responses.  A song entitled "Damaged People" was then played, followed by a discussion of the relevance/relatedness of this song to each patient.   The conclusion of the group was that our goal as humans does not need to be perfection, but rather growth.  Insights among group members were shared, including that it is easy to point the fingers at others as being damaged, but actually we need to realize that we also are flawed humans with problems to overcome.  The group concluded with an emphasis on how this is ultimately a message of hope that we face struggles like every other person in the world, and that we are not alone.  Therapeutic Goals: 1)  introduce the concept of pain and hardship being universal  2)  connect emotionally to a musical message and to other group members  3)  identify the patient's current beliefs about their own broken methods of resolving their life problems to date, specifically related to this hospitalization  4)  allow time and space for patients to vent their pain and receive support from other patients  5)  elicit hope that arises from realizing we are not alone in our human struggles   Summary of Patient Progress:  The patient expressed that he would accept help if he was told he needed it, saying "sometimes you need to accept help.".  He shared freely throughout group and asked numerous questions.  He talked about anger issues between him and his mother that originate with mother's anger, and received feedback from other group members that he seemed to find helpful..  Therapeutic Modalities:   Motivational Interviewing Activity  Lynnell Chad  05/16/2018 2:45 PM

## 2018-05-17 MED ORDER — DICYCLOMINE HCL 10 MG PO CAPS
10.0000 mg | ORAL_CAPSULE | Freq: Three times a day (TID) | ORAL | Status: DC
  Administered 2018-05-17 – 2018-05-18 (×5): 10 mg via ORAL
  Filled 2018-05-17 (×8): qty 1

## 2018-05-17 NOTE — [facility] Group Notes (Signed)
[facility] LCSW Group Therapy Note  Date/Time:  05/17/2018  11:00AM-12:00PM  Type of Therapy and Topic:  Group Therapy:  Music and Mood  Participation Level:  Active   Description of Group: In this process group, members listened to a variety of genres of music and identified that different types of music evoke different responses.  Patients were encouraged to identify music that was soothing for them and music that was energizing for them.  Patients discussed how this knowledge can help with wellness and recovery in various ways including managing depression and anxiety as well as encouraging healthy sleep habits.    Therapeutic Goals: 1. Patients will explore the impact of different varieties of music on mood 2. Patients will verbalize the thoughts they have when listening to different types of music 3. Patients will identify music that is soothing to them as well as music that is energizing to them 4. Patients will discuss how to use this knowledge to assist in maintaining wellness and recovery 5. Patients will explore the use of music as a coping skill  Summary of Patient Progress:  At the beginning of group, patient expressed that he felt "pretty good, in a better state of mind."  He stated he would love to be with his mother, but realizes that she loves that he is getting help.  He participated fully if somewhat intrusively at times.  He stated at the end of group that he felt even better after listening to music.  Therapeutic Modalities: Solution Focused Brief Therapy Activity   Ambrose Mantle, LCSW

## 2018-05-17 NOTE — Progress Notes (Signed)
Patient has been asleep since shift change. Respirations even and unlabored. Safety maintained on unit with 15 min checks.

## 2018-05-17 NOTE — Progress Notes (Signed)
Patient got up and requested medication to help him sleep. Writer introduced self and he received his scheduled trazodone. Snack offered but he declined. Writer asked if he got upset after talking to his mother and he reported that he did because he thought he was leaving today and is bored here. Support given and safety maintained on unit with 15 min checks.

## 2018-05-17 NOTE — Progress Notes (Signed)
[facility] MD Progress Note  05/17/2018 11:10 AM Logan Harper  MRN:  [id number] Subjective:   Patient is a 23 year old male with a past psychiatric history significant for major depression with psychotic features, as well as personality disorder.  He was admitted on 05/13/2018 secondary to suicidal thoughts, history of volatile and violent behaviors that have led to multiple hospitalizations.  Objective: Patient is seen and examined.  Patient is a 23 year old male with the above-stated past psychiatric history is seen in follow-up.  He denied any psychiatric complaint today.  His sleep is good.  He continues to request protein shakes, and stated that he is having some abdominal cramping.  We discussed potentially adding dicyclomine.  He denied any auditory or visual hallucinations.  He denied any suicidal or homicidal ideation.  His blood pressure this morning is good, he is mildly tachycardic with a rate of 112.  He slept 5 hours last night.  Principal Problem: <principal problem not specified> Diagnosis: Active Problems:   MDD (major depressive disorder), recurrent, severe, with psychosis (HCC)  Total Time spent with patient: 15 minutes  Past Psychiatric History: See admission H&P  Past Medical History:  Past Medical History:  Diagnosis Date  . Anxiety   . Depression   . Diabetes mellitus without complication (HCC)   . Headache(784.0)   . Obesity   . Suicidal ideation     Past Surgical History:  Procedure Laterality Date  . NO PAST SURGERIES     Family History: History reviewed. No pertinent family history. Family Psychiatric  History: See admission H&P Social History:  Social History   Substance and Sexual Activity  Alcohol Use No     Social History   Substance and Sexual Activity  Drug Use No    Social History   Socioeconomic History  . Marital status: Single    Spouse name: Not on file  . Number of children: Not on file  . Years of education: Not on file  . Highest  education level: Not on file  Occupational History  . 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 Financial resource strain: Not on file  . Food insecurity:    Worry: Not on file    Inability: Not on file  . Transportation needs:    Medical: Not on file    Non-medical: Not on file  Tobacco Use  . Smoking status: Never Smoker  . Smokeless tobacco: Never Used  Substance and Sexual Activity  . Alcohol use: No  . Drug use: No  . Sexual activity: Never  Lifestyle  . Physical activity:    Days per week: Not on file    Minutes per session: Not on file  . Stress: Not on file  Relationships  . Social connections:    Talks on phone: Not on file    Gets together: Not on file    Attends religious service: Not on file    Active member of club or organization: Not on file    Attends meetings of clubs or organizations: Not on file    Relationship status: Not on file  Other Topics Concern  . Not on file  Social History Narrative  . Not on file   Additional Social History:                         Sleep: Fair  Appetite:  Good  Current Medications: Current Facility-Administered Medications  Medication  Dose Route Frequency Provider Last Rate Last Dose  . ARIPiprazole ER (ABILIFY MAINTENA) injection 400 mg  400 mg Intramuscular Q28 days Malvin Johns, MD   400 mg at 05/14/18 1405  . escitalopram (LEXAPRO) tablet 10 mg  10 mg Oral Daily Malvin Johns, MD   10 mg at 05/17/18 0736  . ferrous sulfate tablet 325 mg  325 mg Oral Q breakfast Malvin Johns, MD   325 mg at 05/17/18 0735  . gabapentin (NEURONTIN) tablet 600 mg  600 mg Oral TID Malvin Johns, MD   600 mg at 05/17/18 0736  . hydrOXYzine (ATARAX/VISTARIL) tablet 50 mg  50 mg Oral TID PRN Donell Sievert E, PA-C   50 mg at 05/16/18 0403  . loratadine (CLARITIN) tablet 10 mg  10 mg Oral QPM Rankin, Shuvon B, NP   10 mg at 05/16/18 1746  . pantoprazole (PROTONIX) EC tablet 40 mg  40 mg  Oral BID Malvin Johns, MD   40 mg at 05/17/18 9407  . potassium chloride SA (K-DUR) CR tablet 20 mEq  20 mEq Oral Daily Rankin, Shuvon B, NP   20 mEq at 05/17/18 0736  . propranolol (INDERAL) tablet 40 mg  40 mg Oral BID Rankin, Shuvon B, NP   40 mg at 05/17/18 0735  . traZODone (DESYREL) tablet 100 mg  100 mg Oral QHS Donell Sievert E, PA-C   100 mg at 05/17/18 0032    Lab Results: No results found for this or any previous visit (from the past 48 hour(s)).  Blood Alcohol level:  Lab Results  Component Value Date   ETH <10 05/11/2018   ETH <5 01/20/2014    Metabolic Disorder Labs: Lab Results  Component Value Date   HGBA1C 5.7 (H) 09/02/2012   MPG 117 (H) 09/02/2012   MPG 103 11/21/2011   Lab Results  Component Value Date   PROLACTIN 9.1 09/02/2012   Lab Results  Component Value Date   CHOL 177 (H) 09/02/2012   TRIG 136 09/02/2012   HDL 35 09/02/2012   CHOLHDL 5.1 09/02/2012   VLDL 27 09/02/2012   LDLCALC 115 (H) 09/02/2012   LDLCALC 93 11/22/2011    Physical Findings: AIMS: Facial and Oral Movements Muscles of Facial Expression: None, normal Lips and Perioral Area: None, normal Jaw: None, normal Tongue: None, normal,Extremity Movements Upper (arms, wrists, hands, fingers): None, normal Lower (legs, knees, ankles, toes): None, normal, Trunk Movements Neck, shoulders, hips: None, normal, Overall Severity Severity of abnormal movements (highest score from questions above): None, normal Incapacitation due to abnormal movements: None, normal Patient's awareness of abnormal movements (rate only patient's report): No Awareness, Dental Status Current problems with teeth and/or dentures?: No Does patient usually wear dentures?: No  CIWA:    COWS:     Musculoskeletal: Strength & Muscle Tone: within normal limits Gait & Station: normal Patient leans: N/A  Psychiatric Specialty Exam: Physical Exam  Nursing note and vitals reviewed. Constitutional: He is oriented to  person, place, and time. He appears well-developed and well-nourished.  HENT:  Head: Normocephalic and atraumatic.  Respiratory: Effort normal.  Neurological: He is alert and oriented to person, place, and time.    ROS  Blood pressure 99/73, pulse (!) 112, temperature 98 F (36.7 C), temperature source Oral, resp. rate 20, height 6\' 2"  (1.88 m), weight 136.1 kg, SpO2 97 %.Body mass index is 38.52 kg/m.  General Appearance: Casual  Eye Contact:  Good  Speech:  Normal Rate  Volume:  Normal  Mood:  Anxious  Affect:  Congruent  Thought Process:  Coherent and Descriptions of Associations: Intact  Orientation:  Full (Time, Place, and Person)  Thought Content:  Logical  Suicidal Thoughts:  No  Homicidal Thoughts:  No  Memory:  Immediate;   Fair Recent;   Fair Remote;   Fair  Judgement:  Intact  Insight:  Fair  Psychomotor Activity:  Increased  Concentration:  Concentration: Fair and Attention Span: Fair  Recall:  FiservFair  Fund of Knowledge:  Fair  Language:  Fair  Akathisia:  Negative  Handed:  Right  AIMS (if indicated):     Assets:  Desire for Improvement Resilience  ADL's:  Intact  Cognition:  WNL  Sleep:  Number of Hours: 5     Treatment Plan Summary: Daily contact with patient to assess and evaluate symptoms and progress in treatment, Medication management and Plan : : Patient is seen and examined.  Patient is a 23 year old male with the above-stated past psychiatric history is seen in follow-up.    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  His sleep is not great, and he is complaining of some abdominal issues.  He is already on Protonix.  I am going to start Bentyl 10 mg p.o. 3 times daily and before meals at bedtime.  It may be the iron sulfate that is causing his GI discomfort.  I am going to increase his  trazodone up to 150 mg p.o. nightly so he can get some sleep. 1.  Patient received Abilify long-acting injection on 05/14/2018 for psychosis and mood stability. 2.  Continue Lexapro 10 mg p.o. daily for anxiety and depression. 3.  Continue ferrous sulfate 325 mg p.o. daily for anemia. 4.  Continue gabapentin 600 mg p.o. 3 times daily for mood stability and irritability. 5.  Continue Claritin 10 mg p.o. daily for seasonal allergies. 6.  Continue Protonix 40 mg p.o. twice daily for GERD. 7.  Continue potassium chloride 20 mEq p.o. daily for hypokalemia. 8.  Continue propranolol 40 mg p.o. twice daily for tremor, anxiety. 9.    Increase trazodone to 150 mg p.o. nightly as needed insomnia.  9.  Add Bentyl 10 mg p.o. 3 times daily before meals and at bedtime for abdominal cramping. 11.  Disposition planning-in progress.  Antonieta PertGreg Lawson Clary, MD 05/17/2018, 11:10 AM

## 2018-05-17 NOTE — [facility] Group Notes (Signed)
[facility] Group Notes:  (Nursing/MHT/Case Management/Adjunct)  Date:  05/16/2018  Time:  4:00 PM  Type of Therapy:  Nurse Education  Participation Level:  Active  Participation Quality:  Appropriate and Attentive  Affect:  Appropriate  Cognitive:  Alert and Appropriate  Insight:  Appropriate  Engagement in Group:  Engaged and Improving  Modes of Intervention:  Discussion and Education  Summary of Progress/Problems: Pt's discussed anger, triggers, what defines who we are, and how we can cope with the situations/triggers we can't control  Raylene Miyamoto 05/17/2018, 9:46 AM

## 2018-05-17 NOTE — Plan of Care (Signed)
Progress note  Pt found in at the nurses station; compliant with medication administration. Pt denies any physical pain or symptoms. Pt is animated, intrusive, needy, but pleasant in his approach. Pt denies si/hi/ah/vh and verbally agrees to approach staff if these become apparent or before harming himself/others while at [facility]. Pt safe on the unit. Q69m safety checks implemented and continued. Pt provided support and encouragement. Pt given medication per protocol and standing orders. Will continue to monitor.   Pt progressing in the following metrics  Problem: Coping: Goal: Ability to verbalize frustrations and anger appropriately will improve Outcome: Progressing   Problem: Health Behavior/Discharge Planning: Goal: Ability to implement measures to prevent violent behavior in the future will improve Outcome: Progressing   Problem: Safety: Goal: Ability to demonstrate self-control will improve Outcome: Progressing Goal: Ability to redirect hostility and anger into socially appropriate behaviors will improve Outcome: Progressing

## 2018-05-18 MED ORDER — MAGNESIUM HYDROXIDE 400 MG/5ML PO SUSP: 30.0000 mL | Freq: Every day | ORAL | Status: DC | PRN

## 2018-05-18 MED ORDER — ESCITALOPRAM OXALATE 10 MG PO TABS: 10.0000 mg | ORAL_TABLET | Freq: Every day | ORAL | 1 refills | Status: DC

## 2018-05-18 MED ORDER — FERROUS SULFATE 325 (65 FE) MG PO TABS: ORAL_TABLET | ORAL | 1 refills | Status: DC

## 2018-05-18 MED ORDER — ALUM & MAG HYDROXIDE-SIMETH 200-200-20 MG/5ML PO SUSP: 30.0000 mL | ORAL | Status: DC | PRN

## 2018-05-18 MED ORDER — FERROUS SULFATE 325 (65 FE) MG PO TABS: ORAL_TABLET | ORAL | 1 refills | Status: AC

## 2018-05-18 MED ORDER — LORATADINE 10 MG PO TABS: 10.0000 mg | ORAL_TABLET | Freq: Every evening | ORAL | 1 refills | Status: AC

## 2018-05-18 MED ORDER — OMEPRAZOLE MAGNESIUM 20 MG PO TBEC: 20.0000 mg | DELAYED_RELEASE_TABLET | Freq: Every day | ORAL | 3 refills | Status: DC

## 2018-05-18 MED ORDER — DICYCLOMINE HCL 10 MG PO CAPS: 10.0000 mg | ORAL_CAPSULE | Freq: Three times a day (TID) | ORAL | 1 refills | Status: DC

## 2018-05-18 MED ORDER — GABAPENTIN 600 MG PO TABS: 600.0000 mg | ORAL_TABLET | Freq: Three times a day (TID) | ORAL | 2 refills | Status: AC

## 2018-05-18 MED ORDER — ARIPIPRAZOLE ER 400 MG IM SRER: 400.0000 mg | INTRAMUSCULAR | 11 refills | Status: AC

## 2018-05-18 MED ORDER — ACETAMINOPHEN 325 MG PO TABS: 650.0000 mg | ORAL_TABLET | Freq: Four times a day (QID) | ORAL | Status: DC | PRN
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
## 2018-05-18 NOTE — Discharge Summary (Signed)
Physician Discharge Summary Note  Patient:  Logan Harper is an 23 y.o., male MRN:  [id number] DOB:  1995-09-05 Patient phone:  [phone] (home)  Patient address:   [address] [place] [address],  Total Time spent with patient: 45 minutes  Date of Admission:  05/12/2018 Date of Discharge: 05/18/2018  Reason for Admission:   This is the latest admission/healthcare encounter at this facility and others for Logan Harper a 23 year old patient living with his mother he was brought to the emergency department, he had numerous past diagnoses to include ADD recurrent depression, personality disorder, he has an IQ of 85 according to the past record.  He has a history of volatile and violent behaviors leading to multiple hospitalizations. What brought him to the hospital this time was thoughts of killing himself without specific plans on my examination and he states he is not sure why he has these thoughts he "just has them" and gives me very little to work with as far as cognitive-based therapy.  He allows me to talk to his mother who is been trying to reach [place] I phoned her soon as I could after rounds she was very angry about the delay in talking to staff and at first did not want to talk to me stating she was tired of telling people the same thing over and over but she did eventually calm down a little bit and give me information about the patient.  [facility], he been stable for 5 years on clozapine by stable she meant that he had not been violent, but he did not like the way it made him feel and was transition to [place] and that was less successful in this hospitalization she believes is the result.  He is on Lexapro which has helped with depression he has been on histamine blockers for irritable bowel. Currently the patient is alert minimally cooperative eye contact fair denies wanting to harm self while here but states that thoughts are still in his head but  no specific plans denies wanting to harm others, can contract here understands what that means denies auditory and visual hallucinations.    Principal Problem: <principal problem not specified> Discharge Diagnoses: Active Problems:   MDD (major depressive disorder), recurrent, severe, with psychosis (HCC)   Past Psychiatric History: extensive  Past Medical History:  Past Medical History:  Diagnosis Date  . Anxiety   . Depression   . Diabetes mellitus without complication (HCC)   . Headache(784.0)   . Obesity   . Suicidal ideation     Past Surgical History:  Procedure Laterality Date  . NO PAST SURGERIES     Family History: History reviewed. No pertinent family history. Family Psychiatric  History: ukn Social History:  Social History   Substance and Sexual Activity  Alcohol Use No     Social History   Substance and Sexual Activity  Drug Use No    Social History   Socioeconomic History  . Marital status: Single    Spouse name: Not on file  . Number of children: Not on file  . Years of education: Not on file  . Highest education level: Not on file  Occupational History  . 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 Financial resource strain: Not on file  . Food insecurity:    Worry: Not on file    Inability: Not on file  . Transportation  needs:    Medical: Not on file    Non-medical: Not on file  Tobacco Use  . Smoking status: Never Smoker  . Smokeless tobacco: Never Used  Substance and Sexual Activity  . Alcohol use: No  . Drug use: No  . Sexual activity: Never  Lifestyle  . Physical activity:    Days per week: Not on file    Minutes per session: Not on file  . Stress: Not on file  Relationships  . Social connections:    Talks on phone: Not on file    Gets together: Not on file    Attends religious service: Not on file    Active member of club or organization: Not on file    Attends meetings of clubs  or organizations: Not on file    Relationship status: Not on file  Other Topics Concern  . Not on file  Social History Narrative  . Not on file    Hospital Course:    For the most part the patient was not a behavior problem here he did have 1 day where he was very difficult lying in the floor banging on my door requiring IM medications and seclusion and no restraint and he did receive IM Geodon and IM Ativan at 20 mg and 4 mg respectively on Friday again due to disruptive behaviors and oppositional behaviors that were not propelled by psychosis but his personality disorder.  After that he tended to stay generally calm and compliant and cooperative he was apologetic for some of his behaviors.  By the date of the 11th he had his long-acting injectable he was alert and oriented cooperative without thoughts of harming self or others was certainly baseline and med adjustments were made.  We did put him on low-dose iron, kept him on his beta-blocker we think these are protective measures given that he is on an antipsychotic.  Again no thoughts of harming self or others no acute psychosis stable for release  Physical Findings: AIMS: Facial and Oral Movements Muscles of Facial Expression: None, normal Lips and Perioral Area: None, normal Jaw: None, normal Tongue: None, normal,Extremity Movements Upper (arms, wrists, hands, fingers): None, normal Lower (legs, knees, ankles, toes): None, normal, Trunk Movements Neck, shoulders, hips: None, normal, Overall Severity Severity of abnormal movements (highest score from questions above): None, normal Incapacitation due to abnormal movements: None, normal Patient's awareness of abnormal movements (rate only patient's report): No Awareness, Dental Status Current problems with teeth and/or dentures?: No Does patient usually wear dentures?: No  CIWA:    COWS:      Musculoskeletal: Strength & Muscle Tone: within normal limits Gait & Station:  normal Patient leans: N/A  Psychiatric Specialty Exam: ROS  Blood pressure (!) 106/52, pulse 79, temperature 97.9 F (36.6 C), temperature source Oral, resp. rate 20, height  (1.88 m), weight 136.1 kg, SpO2 97 %.Body mass index is 38.52 kg/m.  General Appearance: Casual  Eye Contact::  Good  Speech:  Clear and Coherent409  Volume:  Normal  Mood:  Euthymic  Affect:  Constricted  Thought Process:  Coherent and Linear  Orientation:  Full (Time, Place, and Person)  Thought Content:  Logical and Tangential  Suicidal Thoughts:  No  Homicidal Thoughts:  No  Memory:  Immediate;   Fair  Judgement:  Fair  Insight:  Fair  Psychomotor Activity:  Normal  Concentration:  Fair  Recall:  [employer] of Knowledge:Fair  Language: Fair  Akathisia:  Negative  Handed:  Right  AIMS (if indicated):     Assets:  Communication Skills Leisure Time Physical Health Resilience Social Support  Sleep:  Number of Hours: 4  Cognition: WNL  ADL's:  Intact      Has this patient used any form of tobacco in the last 30 days? (Cigarettes, Smokeless Tobacco, Cigars, and/or Pipes) Yes, No  Blood Alcohol level:  Lab Results  Component Value Date   ETH <10 05/11/2018   ETH <5 01/20/2014    Metabolic Disorder Labs:  Lab Results  Component Value Date   HGBA1C 5.7 (H) 09/02/2012   MPG 117 (H) 09/02/2012   MPG 103 11/21/2011   Lab Results  Component Value Date   PROLACTIN 9.1 09/02/2012   Lab Results  Component Value Date   CHOL 177 (H) 09/02/2012   TRIG 136 09/02/2012   HDL 35 09/02/2012   CHOLHDL 5.1 09/02/2012   VLDL 27 09/02/2012   LDLCALC 115 (H) 09/02/2012   LDLCALC 93 11/22/2011    See Psychiatric Specialty Exam and Suicide Risk Assessment completed by Attending Physician prior to discharge.  Discharge destination:  Home  Is patient on multiple antipsychotic therapies at discharge:  No   Has Patient had three or more failed trials of antipsychotic monotherapy by history:   No  Recommended Plan for Multiple Antipsychotic Therapies: NA   Allergies as of 05/18/2018   No Known Allergies     Medication List    STOP taking these medications   levocetirizine 5 MG tablet Commonly known as:  XYZAL Replaced by:  loratadine 10 MG tablet     TAKE these medications     Indication  ARIPiprazole ER 400 MG Srer injection Commonly known as:  ABILIFY MAINTENA Inject 2 mLs (400 mg total) into the muscle every 28 (twenty-eight) days. Due 6/7 Show to doctor- no need to fill Start taking on:  June 11, 2018  Indication:  Schizophrenia   dicyclomine 10 MG capsule Commonly known as:  BENTYL Take 1 capsule (10 mg total) by mouth 4 (four) times daily -  before meals and at bedtime.  Indication:  Irritable Bowel Syndrome   escitalopram 10 MG tablet Commonly known as:  LEXAPRO Take 1 tablet (10 mg total) by mouth daily. Start taking on:  May 19, 2018  Indication:  Major Depressive Disorder   ferrous sulfate 325 (65 FE) MG tablet 1 every other day  Indication:  Iron Deficiency   gabapentin 600 MG tablet Commonly known as:  NEURONTIN Take 1 tablet (600 mg total) by mouth 3 (three) times daily.  Indication:  Neuropathic Pain   loratadine 10 MG tablet Commonly known as:  CLARITIN Take 1 tablet (10 mg total) by mouth every evening. Replaces:  levocetirizine 5 MG tablet  Indication:  Upper Respiratory Tract Allergy   propranolol 20 MG tablet Commonly known as:  INDERAL Take 2 tablets (40 mg total) by mouth 2 (two) times daily.  Indication:  High Blood Pressure Disorder      Follow-up Information    [facility] Follow up on 05/28/2018.   Specialty:  Behavioral Health Why:  Medication management appointment with Erling ConteJo Hudges is Thursday, 5/21 at 10:00a.  Please bring your current medications and discharge paperwork from this hospitalization. Contact information: [address] [place] [phone]          SignedMalvin Johns: FARAH,BRIAN, MD 05/18/2018, 9:42 AM

## 2018-05-18 NOTE — Progress Notes (Signed)
Recreation Therapy Notes  Date: 5.11.20 Time: 1000 Location: [address] Dayroom  Group Topic: Coping Skills  Goal Area(s) Addresses:  Patient will identify positive coping skills. Patient will identify benefits of using coping skills post d/c.  Behavioral Response:  Engaged  Intervention:  Worksheet, pencils  Activity: Mind map.  LRT filled in the first eight boxes (anger, depression, peer pressure, anxiety, frustration, addiction, OCD/PTSD, and work) with the patients.  Patients were to then come up with three coping skills for each trigger identified.  LRT will write the coping skills on the board the patients were able to come up with.  Education: Pharmacologist, Building control surveyor.   Education Outcome: Acknowledges understanding/In group clarification offered/Needs additional education.   Clinical Observations/Feedback:  Pt was social and active during group.  Pt did need some redirection from side conversations.  Pt identified some of his coping skills as meditation, physical activity, and work reasonable hours.    Caroll Rancher, LRT/CTRS        Caroll Rancher A 05/18/2018 11:39 AM

## 2018-05-18 NOTE — Progress Notes (Signed)
  [facility] Adult Case Management Discharge Plan :  Will you be returning to the same living situation after discharge:  Yes,  with mother At discharge, do you have transportation home?: Yes,  mother to send Benedetto Goad. Do you have the ability to pay for your medications: Yes,  medicaid  Release of information consent forms completed and in the chart;  Patient's signature needed at discharge.  Patient to Follow up at: Follow-up Information    [facility] Follow up on 05/28/2018.   Specialty:  Behavioral Health Why:  Medication management appointment with Erling Conte is Thursday, 5/21 at 10:00a.  Please bring your current medications and discharge paperwork from this hospitalization. Contact information: [address] [place] [address] [phone]        [facility] Follow up.   Why:  You have been referred to the Transition Care Team for possible in home services.  You will receive a phone call from [facility] regarding this service.  You can call Mauri Brooklyn at [phone] as well.  Contact information: [address] [place] [address] [phone]           Next level of care provider has access to [facility] Link:no  Safety Planning and Suicide Prevention discussed: Yes,  with mother     Has patient been referred to the Quitline?: Patient refused referral  Patient has been referred for addiction treatment: N/A  Lorri Frederick, LCSW 05/18/2018, 11:31 AM

## 2018-05-18 NOTE — Progress Notes (Signed)
Recreation Therapy Notes  INPATIENT RECREATION TR PLAN  Patient Details Name: Logan Harper MRN: [id number] DOB: [birth year] Today's Date: 05/18/2018  Rec Therapy Plan Is patient appropriate for Therapeutic Recreation?: Yes Treatment times per week: about 3 days Estimated Length of Stay: 5-7 days TR Treatment/Interventions: Group participation (Comment)  Discharge Criteria Pt will be discharged from therapy if:: Discharged Treatment plan/goals/alternatives discussed and agreed upon by:: Patient/family  Discharge Summary Short term goals set: See patient care plan Short term goals met: Complete Progress toward goals comments: Groups attended Which groups?: Coping skills, Communication, Other (Comment)(Team building) Reason goals not met: None Therapeutic equipment acquired: N/A Reason patient discharged from therapy: Discharge from hospital Pt/family agrees with progress & goals achieved: Yes Date patient discharged from therapy: 05/18/18    Victorino Sparrow, LRT/CTRS   Ria Comment, [place] 05/18/2018, 11:58 AM

## 2018-05-18 NOTE — Plan of Care (Signed)
Pt was able to identify coping skills at completion of recreation therapy group sessions.   Marjette Lindsay, LRT/CTRS 

## 2018-05-18 NOTE — Progress Notes (Signed)
Pt discharged to lobby. Pt was stable and appreciative at that time. All papers and prescriptions were given and valuables returned. Verbal understanding expressed. Denies SI/HI and A/VH. Pt given opportunity to express concerns and ask questions.  

## 2018-05-18 NOTE — Progress Notes (Addendum)
Phone call from mother, Lambert Mody, quite irritable that someone called in a prescription to a pharmacy that they no longer use.  She had several specific medication questions and CSW told her that the MD would need address these with her. Garner Nash, MSW, LCSW Clinical Social Worker 05/18/2018 11:22 AM   CSW also received call from [place] at [facility]/TCT: they will be contacting pt for possible TCT services as well. Garner Nash, MSW, LCSW Clinical Social Worker 05/18/2018 11:27 AM

## 2018-05-20 ENCOUNTER — Observation Stay (HOSPITAL_COMMUNITY)
Admission: RE | Admit: 2018-05-20 | Discharge: 2018-05-21 | Disposition: A | Discharge status: Home / Self Care | Payer: Medicaid Other | Attending: Psychiatry | Admitting: Psychiatry

## 2018-05-20 DIAGNOSIS — F911 Conduct disorder, childhood-onset type: Secondary | ICD-10-CM | POA: Diagnosis present

## 2018-05-20 DIAGNOSIS — F209 Schizophrenia, unspecified: Secondary | ICD-10-CM | POA: Diagnosis not present

## 2018-05-20 DIAGNOSIS — F902 Attention-deficit hyperactivity disorder, combined type: Secondary | ICD-10-CM | POA: Diagnosis present

## 2018-05-20 DIAGNOSIS — E119 Type 2 diabetes mellitus without complications: Secondary | ICD-10-CM | POA: Diagnosis not present

## 2018-05-20 DIAGNOSIS — Z6837 Body mass index (BMI) 37.0-37.9, adult: Secondary | ICD-10-CM | POA: Insufficient documentation

## 2018-05-20 DIAGNOSIS — I10 Essential (primary) hypertension: Secondary | ICD-10-CM | POA: Insufficient documentation

## 2018-05-20 DIAGNOSIS — F419 Anxiety disorder, unspecified: Secondary | ICD-10-CM | POA: Insufficient documentation

## 2018-05-20 DIAGNOSIS — F909 Attention-deficit hyperactivity disorder, unspecified type: Secondary | ICD-10-CM | POA: Insufficient documentation

## 2018-05-20 DIAGNOSIS — F919 Conduct disorder, unspecified: Secondary | ICD-10-CM | POA: Diagnosis not present

## 2018-05-20 DIAGNOSIS — E669 Obesity, unspecified: Secondary | ICD-10-CM | POA: Diagnosis not present

## 2018-05-20 DIAGNOSIS — R45851 Suicidal ideations: Secondary | ICD-10-CM | POA: Insufficient documentation

## 2018-05-20 DIAGNOSIS — F333 Major depressive disorder, recurrent, severe with psychotic symptoms: Secondary | ICD-10-CM | POA: Diagnosis present

## 2018-05-20 DIAGNOSIS — Z79899 Other long term (current) drug therapy: Secondary | ICD-10-CM | POA: Insufficient documentation

## 2018-05-20 DIAGNOSIS — F332 Major depressive disorder, recurrent severe without psychotic features: Secondary | ICD-10-CM | POA: Diagnosis not present

## 2018-05-20 MED ORDER — ZIPRASIDONE MESYLATE 20 MG IM SOLR
20.0000 mg | Freq: Once | INTRAMUSCULAR | Status: AC
  Administered 2018-05-20: 20 mg via INTRAMUSCULAR
  Filled 2018-05-20: qty 20

## 2018-05-20 MED ORDER — ZIPRASIDONE MESYLATE 20 MG IM SOLR
20.0000 mg | INTRAMUSCULAR | Status: DC | PRN
  Filled 2018-05-20: qty 20

## 2018-05-20 MED ORDER — MAGNESIUM HYDROXIDE 400 MG/5ML PO SUSP
30.0000 mL | Freq: Every day | ORAL | Status: DC | PRN
  Filled 2018-05-20: qty 30

## 2018-05-20 MED ORDER — DIPHENHYDRAMINE HCL 50 MG/ML IJ SOLN
50.0000 mg | Freq: Once | INTRAMUSCULAR | Status: AC
  Administered 2018-05-20: 50 mg via INTRAMUSCULAR
  Filled 2018-05-20: qty 1

## 2018-05-20 MED ORDER — ACETAMINOPHEN 325 MG PO TABS
650.0000 mg | ORAL_TABLET | Freq: Four times a day (QID) | ORAL | Status: DC | PRN
  Administered 2018-05-21: 650 mg via ORAL
  Filled 2018-05-20 (×2): qty 2

## 2018-05-20 MED ORDER — ALUM & MAG HYDROXIDE-SIMETH 200-200-20 MG/5ML PO SUSP
30.0000 mL | ORAL | Status: DC | PRN
  Filled 2018-05-20: qty 30

## 2018-05-20 MED ORDER — HYDROXYZINE HCL 50 MG PO TABS
50.0000 mg | ORAL_TABLET | Freq: Four times a day (QID) | ORAL | Status: DC | PRN
  Administered 2018-05-21: 50 mg via ORAL
  Filled 2018-05-20: qty 1

## 2018-05-20 MED ORDER — OLANZAPINE 10 MG PO TBDP
10.0000 mg | ORAL_TABLET | Freq: Three times a day (TID) | ORAL | Status: DC | PRN
  Administered 2018-05-21: 05:00:00 10 mg via ORAL
  Filled 2018-05-20 (×2): qty 1

## 2018-05-20 MED ORDER — HYDROXYZINE HCL 25 MG PO TABS
25.0000 mg | ORAL_TABLET | Freq: Four times a day (QID) | ORAL | Status: DC | PRN
  Filled 2018-05-20: qty 1

## 2018-05-20 MED ORDER — LORAZEPAM 2 MG/ML IJ SOLN
2.0000 mg | Freq: Once | INTRAMUSCULAR | Status: AC
  Administered 2018-05-20: 2 mg via INTRAMUSCULAR
  Filled 2018-05-20: qty 1

## 2018-05-20 MED ORDER — TRAZODONE HCL 100 MG PO TABS
100.0000 mg | ORAL_TABLET | Freq: Every evening | ORAL | Status: DC | PRN
  Filled 2018-05-20 (×2): qty 1

## 2018-05-20 MED ORDER — TRAZODONE HCL 100 MG PO TABS
100.0000 mg | ORAL_TABLET | Freq: Every evening | ORAL | Status: DC | PRN
  Administered 2018-05-21: 100 mg via ORAL
  Filled 2018-05-20: qty 1

## 2018-05-20 MED ORDER — LORAZEPAM 1 MG PO TABS
1.0000 mg | ORAL_TABLET | ORAL | Status: DC | PRN
  Filled 2018-05-20 (×2): qty 1

## 2018-05-20 NOTE — Progress Notes (Signed)
Pt came to the unit as walk in escorted by the police. Pt became angry and aggressive stating he has not been asleep for 5 days. Pt stated he wanted some medication to help him" I want some shots" . Pt given 20 mg Geodon, 2 mg Ativan, 50 mg Benadryl per MAR / Donell Sievert.

## 2018-05-20 NOTE — H&P (Signed)
BH Observation Unit Provider Admission PAA/H&P  Patient Identification: Logan Harper MRN:  [id number] Date of Evaluation:  05/20/2018 Chief Complaint:  Suicidal ideations Principal Diagnosis: <principal problem not specified> Diagnosis:  Active Problems:   MDD (major depressive disorder), recurrent episode, severe (HCC)  History of Present Illness Logan Harper is a 23 y/o AAM, [facility] to [facility] erratic, aggressive, oppositional and posturing against staff under the observation of [employer]. He has an extensive past psychiatric history c/w past diagnoses to include ADD recurrent depression, personality disorder, he has an IQ of 5880 according to the past record. He has a history of volatile and violent behaviors leading to multiple hospitalizations. Principal Problem: endorsing SI Discharge Diagnoses: Active Problems:   MDD (major depressive disorder), recurrent, severe, with psychosis (HCC)    Associated Signs/Symptoms: Depression Symptoms:  suicidal thoughts with specific plan, (Hypo) Manic Symptoms:  Irritable Mood, Anxiety Symptoms:  n/a Psychotic Symptoms:  n/a PTSD Symptoms: NA Total Time spent with patient: 20 minutes Past Psychiatric History: see Above  Is the patient at risk to self? Yes.    Has the patient been a risk to self in the past 6 months? Yes.    Has the patient been a risk to self within the distant past? Yes.    Is the patient a risk to others? Yes.    Has the patient been a risk to others in the past 6 months? Yes.    Has the patient been a risk to others within the distant past? Yes.     Prior Inpatient Therapy:   Prior Outpatient Therapy:    Alcohol Screening:   Substance Abuse History in the last 12 months:  No. Consequences of Substance Abuse: NA Previous Psychotropic Medications: Yes  Psychological Evaluations: Yes  Past Medical History:  Past Medical History:  Diagnosis Date  . Anxiety   . Depression   . Diabetes mellitus without complication (HCC)    . Headache(784.0)   . Obesity   . Suicidal ideation     Past Surgical History:  Procedure Laterality Date  . NO PAST SURGERIES     Family History: No family history on file. Family Psychiatric History:Unknown Tobacco Screening:   Social History:  Social History   Substance and Sexual Activity  Alcohol Use No     Social History   Substance and Sexual Activity  Drug Use No    Additional Social History:                           Allergies:  No Known Allergies Lab Results: No results found for this or any previous visit (from the past 48 hour(s)).  Blood Alcohol level:  Lab Results  Component Value Date   ETH <10 05/11/2018   ETH <5 01/20/2014    Metabolic Disorder Labs:  Lab Results  Component Value Date   HGBA1C 5.7 (H) 09/02/2012   MPG 117 (H) 09/02/2012   MPG 103 11/21/2011   Lab Results  Component Value Date   PROLACTIN 9.1 09/02/2012   Lab Results  Component Value Date   CHOL 177 (H) 09/02/2012   TRIG 136 09/02/2012   HDL 35 09/02/2012   CHOLHDL 5.1 09/02/2012   VLDL 27 09/02/2012   LDLCALC 115 (H) 09/02/2012   LDLCALC 93 11/22/2011    Current Medications: Current Outpatient Medications  Medication Sig Dispense Refill  . [START ON 06/11/2018] ARIPiprazole ER (ABILIFY MAINTENA) 400 MG SRER injection Inject 2 mLs (400 mg total)  into the muscle every 28 (twenty-eight) days. Due 6/7 Show to doctor- no need to fill 1 each 11  . escitalopram (LEXAPRO) 10 MG tablet Take 1 tablet (10 mg total) by mouth daily. 90 tablet 1  . ferrous sulfate 325 (65 FE) MG tablet 1 every other day 90 tablet 1  . gabapentin (NEURONTIN) 600 MG tablet Take 1 tablet (600 mg total) by mouth 3 (three) times daily. 90 tablet 2  . loratadine (CLARITIN) 10 MG tablet Take 1 tablet (10 mg total) by mouth every evening. 30 tablet 1  . omeprazole (PRILOSEC OTC) 20 MG tablet Take 1 tablet (20 mg total) by mouth daily. 90 tablet 3  . propranolol (INDERAL) 20 MG tablet Take 2  tablets (40 mg total) by mouth 2 (two) times daily. 60 tablet 2   Current Facility-Administered Medications  Medication Dose Route Frequency Provider Last Rate Last Dose  . acetaminophen (TYLENOL) tablet 650 mg  650 mg Oral Q6H PRN Kerry Hough, PA-C      . alum & mag hydroxide-simeth (MAALOX/MYLANTA) 200-200-20 MG/5ML suspension 30 mL  30 mL Oral Q4H PRN Donell Sievert E, PA-C      . diphenhydrAMINE (BENADRYL) injection 50 mg  50 mg Intramuscular Once Donell Sievert E, PA-C      . hydrOXYzine (ATARAX/VISTARIL) tablet 25 mg  25 mg Oral Q6H PRN Kerry Hough, PA-C      . LORazepam (ATIVAN) injection 2 mg  2 mg Intramuscular Once Kerry Hough, PA-C      . OLANZapine zydis (ZYPREXA) disintegrating tablet 10 mg  10 mg Oral Q8H PRN Kerry Hough, PA-C       And  . LORazepam (ATIVAN) tablet 1 mg  1 mg Oral PRN Donell Sievert E, PA-C       And  . ziprasidone (GEODON) injection 20 mg  20 mg Intramuscular PRN Donell Sievert E, PA-C      . magnesium hydroxide (MILK OF MAGNESIA) suspension 30 mL  30 mL Oral Daily PRN Kerry Hough, PA-C      . [START ON 05/21/2018] traZODone (DESYREL) tablet 100 mg  100 mg Oral QHS,MR X 1 Simon, Spencer E, PA-C      . ziprasidone (GEODON) injection 20 mg  20 mg Intramuscular Once Kerry Hough, PA-C       PTA Medications: (Not in a hospital admission)   Musculoskeletal: Strength & Muscle Tone: within normal limits Gait & Station: normal Patient leans: N/A  Psychiatric Specialty Exam: Physical Exam  Constitutional: He is oriented to person, place, and time. He appears well-developed and well-nourished. He appears distressed.  HENT:  Head: Normocephalic.  Eyes: Pupils are equal, round, and reactive to light.  Respiratory: Effort normal and breath sounds normal. No respiratory distress.  Neurological: He is alert and oriented to person, place, and time. No cranial nerve deficit.  Skin: Skin is warm. He is diaphoretic.  Psychiatric: His speech  is rapid and/or pressured. He is agitated and aggressive. Cognition and memory are impaired. He expresses impulsivity and inappropriate judgment. He exhibits a depressed mood. He expresses no homicidal and no suicidal ideation. He expresses no suicidal plans and no homicidal plans.    Review of Systems  Constitutional: Negative for chills, diaphoresis, fever, malaise/fatigue and weight loss.  Psychiatric/Behavioral: Positive for depression and suicidal ideas. Negative for hallucinations and substance abuse. The patient is nervous/anxious and has insomnia.   All other systems reviewed and are negative.   Blood pressure (!) 147/124,  pulse (!) 124, temperature 98.7 F (37.1 C), temperature source Oral, resp. rate 18, SpO2 100 %.There is no height or weight on file to calculate BMI.  General Appearance: Disheveled  Eye Contact:  Fair  Speech:  Clear and Coherent  Volume:  Increased  Mood:  Angry  Affect:  Inappropriate  Thought Process:  Goal Directed  Orientation:  Full (Time, Place, and Person)  Thought Content:  Illogical  Suicidal Thoughts:  Yes.  without intent/plan  Homicidal Thoughts:  No  Memory:  Immediate;   Poor  Judgement:  Poor  Insight:  Lacking  Psychomotor Activity:  Increased  Concentration:  Concentration: Poor  Recall:  Poor  Fund of Knowledge:  Poor  Language:  Fair  Akathisia:  No  Handed:  Right  AIMS (if indicated):     Assets:  Desire for Improvement  ADL's:  Intact  Cognition:  Impaired,  Moderate  Sleep:         Treatment Plan Summary: Daily contact with patient to assess and evaluate symptoms and progress in treatment  Observation Level/Precautions:  Continuous Observation Laboratory:  Chemistry Profile Psychotherapy:   Medications:   Consultations:   Discharge Concerns:   Estimated LOS: Other:      Kerry Hough, PA-C 5/13/202011:12 PM

## 2018-05-21 ENCOUNTER — Encounter (HOSPITAL_COMMUNITY): Payer: Self-pay

## 2018-05-21 ENCOUNTER — Other Ambulatory Visit: Payer: Self-pay

## 2018-05-21 DIAGNOSIS — F902 Attention-deficit hyperactivity disorder, combined type: Secondary | ICD-10-CM

## 2018-05-21 DIAGNOSIS — F333 Major depressive disorder, recurrent, severe with psychotic symptoms: Secondary | ICD-10-CM | POA: Diagnosis not present

## 2018-05-21 DIAGNOSIS — F911 Conduct disorder, childhood-onset type: Secondary | ICD-10-CM

## 2018-05-21 DIAGNOSIS — Z62820 Parent-biological child conflict: Secondary | ICD-10-CM

## 2018-05-21 DIAGNOSIS — F332 Major depressive disorder, recurrent severe without psychotic features: Secondary | ICD-10-CM | POA: Diagnosis not present

## 2018-05-21 MED ORDER — HYDROXYZINE HCL 50 MG PO TABS
50.0000 mg | ORAL_TABLET | Freq: Once | ORAL | Status: AC
  Administered 2018-05-21: 50 mg via ORAL
  Filled 2018-05-21: qty 1

## 2018-05-21 MED ORDER — LORAZEPAM 2 MG/ML IJ SOLN
2.0000 mg | Freq: Once | INTRAMUSCULAR | Status: AC
  Administered 2018-05-21: 2 mg via INTRAMUSCULAR

## 2018-05-21 MED ORDER — DIPHENHYDRAMINE HCL 50 MG/ML IJ SOLN
50.0000 mg | Freq: Once | INTRAMUSCULAR | Status: AC
  Administered 2018-05-21: 50 mg via INTRAMUSCULAR

## 2018-05-21 MED ORDER — ZIPRASIDONE MESYLATE 20 MG IM SOLR
20.0000 mg | Freq: Once | INTRAMUSCULAR | Status: AC
  Administered 2018-05-21: 20 mg via INTRAMUSCULAR

## 2018-05-21 NOTE — Progress Notes (Signed)
DAR Note 1:1 Pt asleep at present on mattress in seclusion room per his request. Respirations noted and unlabored. Assigned 1:1 staff in attendance at all times per order. Safety checks maintained without incident / outburst to note thus far.

## 2018-05-21 NOTE — Progress Notes (Signed)
Pt continues to be aggravated and irritable, pt stated he wanted to go to a Group home . Pt continued to be focused with a certain security guard , who appeared to provoke pt. Security guard was removed from the environment to help de-escalate pt. Pt continues top pace the hall , rolling in the hall at times. Pt was given 100 mg Trazodone , 50 mg Vistaril per MAR / Donell Sievert.

## 2018-05-21 NOTE — Progress Notes (Signed)
Pt continued to pace the hall " I know how the sytem works, ya'll can't just let me go. I want to go on 500 hall . I'm going to stay awake until I see the doctor " pt constantly deescalated, pt encouraged to go to sleep . Pt constantly fighting medication. Pt finally fell asleep in the floor in the hallway 0120 .

## 2018-05-21 NOTE — [facility] Suicide Risk Assessment (Cosign Needed)
Suicide Risk Assessment  Discharge Assessment   [facility] Discharge Suicide Risk Assessment   Principal Problem: MDD (major depressive disorder), recurrent episode, severe (HCC) Discharge Diagnoses: Principal Problem:   MDD (major depressive disorder), recurrent episode, severe (HCC) Active Problems:   Conduct disorder, childhood-onset type   ADHD (attention deficit hyperactivity disorder), combined type   Total Time spent with patient: 30 minutes  Musculoskeletal: Strength & Muscle Tone: within normal limits Gait & Station: normal Patient leans: N/A  Psychiatric Specialty Exam:   Blood pressure (!) 124/98, pulse 72, temperature 98.2 F (36.8 C), temperature source Oral, resp. rate 20, SpO2 100 %.There is no height or weight on file to calculate BMI.  General Appearance: Casual  Eye Contact::  Poor  Speech:  Pt refused to answer questions409  Volume:  Pt refused to answer questions  Mood:  Angry and Irritable  Affect:  Congruent  Thought Process:  Linear  Orientation:  Other:  Pt refused to answer questions  Thought Content:  Pt refused to answer questions  Suicidal Thoughts:  Pt refused to answer questions  Homicidal Thoughts:  Pt refused to answer questions  Memory:  Pt refused to answer questions  Judgement:  Poor  Insight:  Shallow  Psychomotor Activity:  Decreased  Concentration:  Fair  Recall:  Fair  Fund of Knowledge:Fair  Language: Good  Akathisia:  Negative  Handed:  Right  AIMS (if indicated):     Assets:  Architect Housing Physical Health Social Support  Sleep:     Cognition: WNL  ADL's:  Intact   Mental Status Per Nursing Assessment::   On Admission: Pt presented to [facility] as a walk-in. He was brought in by police. He was at home arguing with his mother. He was discharged from [facility] on 05-18-2018 after a 6 day stay. He was to follow up with [facility] upon discharge and has an appointment there on 05/28/2018 with  Tamela Oddi. Pt has been aggressive while in the OBS unit and has had to be restrained and receive emergency medications. He was placed under IVC by the [facility] psychiatrist due to his behavior. He was assessed this morning while he was in seclusion. He was lying on the mattress on the floor and refused to answer any questions or look at the Dr or this Clinical research associate. He is known to the staff at [facility] and his problems are behavioral. He does not meet criteria for inpatient admission. He is repeatedly asking to go the the 500 hall because he does not want to go back home. Pt's IVC will be rescinded and he will be discharged.   Demographic Factors:  Male, Adolescent or young adult, Low socioeconomic status and Unemployed  Loss Factors: NA  Historical Factors: Family history of mental illness or substance abuse  Risk Reduction Factors:   Living with another person, especially a relative  Continued Clinical Symptoms:  Depression:   Aggression  Cognitive Features That Contribute To Risk:  Closed-mindedness    Suicide Risk:  Minimal: No identifiable suicidal ideation.  Patients presenting with no risk factors but with morbid ruminations; may be classified as minimal risk based on the severity of the depressive symptoms    Plan Of Care/Follow-up recommendations:  Activity:  as tolerated Diet:  Heart healthy  Laveda Abbe, NP 05/21/2018, 12:47 PM

## 2018-05-21 NOTE — Discharge Instructions (Signed)
For your behavioral health needs, you are advised to continue treatment with Tamela Oddi, PA-C at [facility]:       [facility]      [address], [address]      [place], [place] [address]      [phone]

## 2018-05-21 NOTE — Progress Notes (Signed)
Pt continued to be angry and throwing things on the unit.

## 2018-05-21 NOTE — Progress Notes (Signed)
Pt standing at the door banging on the door

## 2018-05-21 NOTE — BH Assessment (Signed)
[facility] Assessment Progress Note  Per Juanetta Beets, DO, this pt does not require psychiatric hospitalization at this time.  Pt presents under IVC initiated by Nehemiah Massed, MD which Dr Sharma Covert has rescinded.  Pt is to be discharged from [facility] Observation Unit with recommendation to continue treatment with Tamela Oddi, PA-C at [facility].  This has been included in pt's discharge instructions.  Pt's nurse, Lincoln Maxin, has been notified.  [name], MA Triage Specialist [phone]

## 2018-05-21 NOTE — Progress Notes (Signed)
At 0600 Pt up banging on the door , pt threw a chair, pt threw cups of water. Pt posturing with staff, but pt was focused on security guard cursing at him and threatening to " fuck you up"  Pt grabbed a staff can and started squeezing it. Staff asked pt to give can back , however pt refused. At that point  staff attempted to pry the can from his hands. Pt started squeezing  can harder ,   Pt started going to the ground while trying to keep staff from getting the can. Once on the ground pt was able to rip can in half.   Staff secured pt while on the ground and removed can out of the pt hand. Pt continued to grab staff (Amy) and twisted her hand .    manual hold at 0620 to 642 ,  pt was given IM 20mg  Geodon, 2 mg Ativan, 50mg  Benadryl per MAR  .  Pt was placed in the restraint chair after IM injections.

## 2018-05-21 NOTE — Progress Notes (Signed)
Pt continues to be doing things trying to get sent to jail.

## 2018-05-21 NOTE — Discharge Summary (Addendum)
Physician Discharge Summary Note  Patient:  Logan Harper is an 23 y.o., male MRN:  [id number] DOB:  [birth year] Patient phone:  [phone] (home)  Patient address:   [address] [place] [address],  Total Time spent with patient: 30 minutes  Date of Admission:  05/20/2018 Date of Discharge: 05/21/2018  Reason for Admission: Pt was at home and arguing with his mother and became aggressive.   Principal Problem: MDD (major depressive disorder), recurrent episode, severe (HCC) Discharge Diagnoses: Principal Problem:   MDD (major depressive disorder), recurrent episode, severe (HCC) Active Problems:   Conduct disorder, childhood-onset type   ADHD (attention deficit hyperactivity disorder), combined type   Past Psychiatric History: As above  Past Medical History:  Past Medical History:  Diagnosis Date  . Anxiety   . Depression   . Diabetes mellitus without complication (HCC)   . Headache(784.0)   . Obesity   . Suicidal ideation     Past Surgical History:  Procedure Laterality Date  . NO PAST SURGERIES     Family History: History reviewed. No pertinent family history. Family Psychiatric  History: None per chart review.  Social History:  Social History   Substance and Sexual Activity  Alcohol Use No     Social History   Substance and Sexual Activity  Drug Use No    Social History   Socioeconomic History  . Marital status: Single    Spouse name: Not on file  . Number of children: Not on file  . Years of education: Not on file  . Highest education level: Not on file  Occupational History  . 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 Financial resource strain: Not on file  . Food insecurity:    Worry: Not on file    Inability: Not on file  . Transportation needs:    Medical: Not on file    Non-medical: Not on file  Tobacco Use  . Smoking status: Never Smoker  . Smokeless tobacco: Never Used   Substance and Sexual Activity  . Alcohol use: No  . Drug use: No  . Sexual activity: Never  Lifestyle  . Physical activity:    Days per week: Not on file    Minutes per session: Not on file  . Stress: Not on file  Relationships  . Social connections:    Talks on phone: Not on file    Gets together: Not on file    Attends religious service: Not on file    Active member of club or organization: Not on file    Attends meetings of clubs or organizations: Not on file    Relationship status: Not on file  Other Topics Concern  . Not on file  Social History Narrative  . Not on file    Hospital Course:    On Admission: Pt presented to [facility] as a walk-in. He was brought in by police. He was at home arguing with his mother. He was discharged from [facility] on 05-18-2018 after a 6 day stay. He was to follow up with [facility] upon discharge and has an appointment there on 05/28/2018 with Tamela Oddi, PA. Pt has been aggressive while in the OBS unit and has had to be restrained and receive emergency medications. He was placed under IVC by the [facility] psychiatrist due to his behavior. He was assessed this morning while he was in seclusion. He was lying on the mattress on  the floor and refused to answer any questions or look at the Dr or this Clinical research associate. He is known to the staff at [facility] and his problems are behavioral. He is not psychotic and is not responding to internal stimuli. He does not meet criteria for inpatient admission. He is repeatedly asking to go the the 500 hall because he does not want to go back home. Pt's IVC will be rescinded and he will be discharged. Pt is psychiatrically clear.   Physical Findings: AIMS: Facial and Oral Movements Muscles of Facial Expression: None, normal Lips and Perioral Area: None, normal Jaw: None, normal Tongue: None, normal,Extremity Movements Upper (arms, wrists, hands, fingers): None, normal Lower (legs, knees, ankles, toes): None, normal, Trunk  Movements Neck, shoulders, hips: None, normal, Overall Severity Severity of abnormal movements (highest score from questions above): None, normal Incapacitation due to abnormal movements: None, normal Patient's awareness of abnormal movements (rate only patient's report): No Awareness, Dental Status Current problems with teeth and/or dentures?: No Does patient usually wear dentures?: No  CIWA:  CIWA-Ar Total: 0 COWS:  COWS Total Score: 4  Musculoskeletal: Strength & Muscle Tone: within normal limits Gait & Station: normal Patient leans: N/A  Psychiatric Specialty Exam:   Blood pressure (!) 124/98, pulse 72, temperature 98.2 F (36.8 C), temperature source Oral, resp. rate 20, SpO2 100 %.There is no height or weight on file to calculate BMI.  General Appearance: Casual  Eye Contact::  Poor  Speech:  Pt refused to answer questions  Volume:  Pt refused to answer questions  Mood:  Angry and Irritable  Affect:  Congruent  Thought Process:  Linear  Orientation:  Other:  Pt refused to answer questions  Thought Content:  Pt refused to answer questions  Suicidal Thoughts:  Pt refused to answer questions  Homicidal Thoughts:  Pt refused to answer questions  Memory:  Pt refused to answer questions  Judgement:  Poor  Insight:  Shallow  Psychomotor Activity:  Decreased  Concentration:  Fair  Recall:  Fair  Fund of Knowledge:Fair  Language: Good  Akathisia:  Negative  Handed:  Right  AIMS (if indicated):     Assets:  Architect Housing Physical Health Social Support  Sleep:   N/A  Cognition: WNL  ADL's:  Intact      Has this patient used any form of tobacco in the last 30 days? (Cigarettes, Smokeless Tobacco, Cigars, and/or Pipes)  N/A  Blood Alcohol level:  Lab Results  Component Value Date   ETH <10 05/11/2018   ETH <5 01/20/2014    Metabolic Disorder Labs:  Lab Results  Component Value Date   HGBA1C 5.7 (H) 09/02/2012   MPG  117 (H) 09/02/2012   MPG 103 11/21/2011   Lab Results  Component Value Date   PROLACTIN 9.1 09/02/2012   Lab Results  Component Value Date   CHOL 177 (H) 09/02/2012   TRIG 136 09/02/2012   HDL 35 09/02/2012   CHOLHDL 5.1 09/02/2012   VLDL 27 09/02/2012   LDLCALC 115 (H) 09/02/2012   LDLCALC 93 11/22/2011    See Psychiatric Specialty Exam and Suicide Risk Assessment completed by Attending Physician prior to discharge.  Discharge destination:  Home  Is patient on multiple antipsychotic therapies at discharge:  No   Has Patient had three or more failed trials of antipsychotic monotherapy by history:  No  Recommended Plan for Multiple Antipsychotic Therapies: NA   Allergies as of 05/21/2018   No Known Allergies  Medication List    TAKE these medications     Indication  ARIPiprazole ER 400 MG Srer injection Commonly known as:  ABILIFY MAINTENA Inject 2 mLs (400 mg total) into the muscle every 28 (twenty-eight) days. Due 6/7 Show to doctor- no need to fill Start taking on:  June 11, 2018  Indication:  Schizophrenia   escitalopram 10 MG tablet Commonly known as:  LEXAPRO Take 1 tablet (10 mg total) by mouth daily.  Indication:  Major Depressive Disorder   ferrous sulfate 325 (65 FE) MG tablet 1 every other day  Indication:  Iron Deficiency   gabapentin 600 MG tablet Commonly known as:  NEURONTIN Take 1 tablet (600 mg total) by mouth 3 (three) times daily.  Indication:  Neuropathic Pain   loratadine 10 MG tablet Commonly known as:  CLARITIN Take 1 tablet (10 mg total) by mouth every evening.  Indication:  Upper Respiratory Tract Allergy   omeprazole 20 MG tablet Commonly known as:  PriLOSEC OTC Take 1 tablet (20 mg total) by mouth daily.  Indication:  Heartburn   propranolol 20 MG tablet Commonly known as:  INDERAL Take 2 tablets (40 mg total) by mouth 2 (two) times daily.  Indication:  High Blood Pressure Disorder        Follow-up  recommendations:  Activity:  as tolerated Diet:  Heart healthy  Comments and Treatment Plan: Take all medications as prescribed by your outpatient provider. Keep all follow-up appointments as scheduled. [facility] that [facility] for [facility] Do not consume alcohol or use illegal drugs while on prescription medications. Report any adverse effects from your medications to your primary care provider promptly.  In the event of recurrent symptoms or worsening symptoms, call 911, a crisis hotline, or go to the nearest emergency department for evaluation.   Signed: Laveda AbbeLaurie Britton Parks, NP 05/21/2018, 12:59 PM   Patient seen face-to-face for psychiatric evaluation, chart reviewed and case discussed with the physician extender and developed treatment plan. Reviewed the information documented and agree with the treatment plan.  Juanetta BeetsJacqueline Norman, DO 05/21/18 3:28 PM

## 2018-05-21 NOTE — Progress Notes (Signed)
Pt has been up the duration of the night, pt fell asleep in the floor. Pt got up posturing , threatening security guard . Pt lying in the floor and placing his hand in the doorway asking staff to smash it in the doorway. Pt verbally deescalated. Pt continued to be verbally threatening wanting to be taken to jail. " Just take me to [facility] "

## 2018-05-21 NOTE — Progress Notes (Addendum)
DAR Note: Pt released from seclusion at 0810 when assisted to the bathroom. Vitals done and documented, WNL. Pt A & O x4 without signs of physical distress. Continues to be loud, verbally abusive while singing/ raping "you mother fucking bitch suck my dick". Currently lying on mattress in seclusion per his request "I just want to stay in here, I'm fine here" room eating breakfast and conversing with assigned 1:1 staff. Safety maintained.

## 2018-05-21 NOTE — Progress Notes (Signed)
D: Pt A & O X 4. Presents calm at this time without verbal aggression towards others. Denies SI, HI, AVH and pain at this time. D/C home as ordered. Spoke with his mother on the phone who agreed to give him an [place] ride home. A: D/C instructions reviewed with pt which included follow up appointment; compliance encouraged. All belongings from assigned locker given to pt at time of departure. PRN medications given with verbal education and effects monitored. Safety checks maintained without incident till time of d/c.  R: Pt receptive to care. Compliant with medications when offered. Denies adverse drug reactions when assessed. Verbalized understanding related to d/c instructions. Signed belonging sheet in agreement with items received from locker. Ambulatory with a steady gait. Appears to be in no physical distress at time of departure.

## 2018-05-21 NOTE — Progress Notes (Deleted)
DAR 1:1 Note: Pt asleep at present on mattress in seclusion room per his request. Respirations noted and unlabored. Assigned 1:1 staff in attendance at all times per order. Safety checks maintained without incident / outburst to note thus far.

## 2018-05-21 NOTE — BH Assessment (Signed)
Assessment Note  Logan Harper is an 23 y.o. male.  -Patient was brought to [facility] as a walk in by the police.  Patient is irritable during assessment.  Police were present during assessment.  Patient said he is upset about arguments with mother, that she nags him all the time.  They got into an argument tonight and he left the home.  Patient talks about wanting to go to a group home to live instead of mom.  He reports having lived in a group home before but does not remember when.  Patient says he is suicidal without a plan.  Patient denies any HI or A/V hallucinations.  Patient gives permission to call mother, Donnivan Villena [phone]. Mother said that tonight patient had gotten upset because she told him "no" to his request to have a friend and her mother come by and visit tonight.  Mother said that patient started to throw things and when she went into the bathroom he started throwing things at her.  She said she called the police.  Mother said that she cannot have him come back to the home and treat her the way he does.  Mother shared that patient has had medication changes recently from his stay at [facility].  She complained that she cannot get through to his regular psychiatrist, Tamela Oddi.  She said that he has a long history of going to PTRFs and group homes.    -clinician discussed with Donell Sievert, PA.  He recommends observation unit and review by psychiatry in AM.  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 Anxiety   . Depression   . Diabetes mellitus without complication (HCC)   . Headache(784.0)   . Obesity   . Suicidal ideation     Past Surgical History:  Procedure Laterality Date  . NO PAST SURGERIES      Family History: No family history on file.  Social History:  reports that he has never smoked. He has never used smokeless tobacco. He reports that he does not  drink alcohol or use drugs.  Additional Social History:  Alcohol / Drug Use Pain Medications: See d/c med list from [facility] Prescriptions: See d/c med list from [facility] Over the Counter: See d/c med list from [facility] History of alcohol / drug use?: No history of alcohol / drug abuse  CIWA: CIWA-Ar BP: (!) 147/124 Pulse Rate: (!) 124 COWS:    Allergies: No Known Allergies  Home Medications:  Medications Prior to Admission  Medication Sig Dispense Refill  . [START ON 06/11/2018] ARIPiprazole ER (ABILIFY MAINTENA) 400 MG SRER injection Inject 2 mLs (400 mg total) into the muscle every 28 (twenty-eight) days. Due 6/7 Show to doctor- no need to fill 1 each 11  . escitalopram (LEXAPRO) 10 MG tablet Take 1 tablet (10 mg total) by mouth daily. 90 tablet 1  . ferrous sulfate 325 (65 FE) MG tablet 1 every other day 90 tablet 1  . gabapentin (NEURONTIN) 600 MG tablet Take 1 tablet (600 mg total) by mouth 3 (three) times daily. 90 tablet 2  . loratadine (CLARITIN) 10 MG tablet Take 1 tablet (10 mg total) by mouth every evening. 30 tablet 1  . omeprazole (PRILOSEC OTC) 20 MG tablet Take 1 tablet (20 mg total) by mouth daily. 90 tablet 3  . propranolol (INDERAL) 20 MG tablet Take 2 tablets (40 mg total) by mouth 2 (two)  times daily. 60 tablet 2    OB/GYN Status:  No LMP for male patient.  General Assessment Data Location of Assessment: [facility] TTS Assessment: In system Is this a Tele or Face-to-Face Assessment?: Face-to-Face Is this an Initial Assessment or a Re-assessment for this encounter?: Initial Assessment Patient Accompanied by:: N/A Language Other than English: No Living Arrangements: Other (Comment)(Living w/ mother) What gender do you identify as?: Male Marital status: Single Pregnancy Status: No Living Arrangements: Parent(w/ mother) Can pt return to current living arrangement?: Yes Admission Status: Voluntary Is patient capable of signing voluntary admission?:  Yes Referral Source: Self/Family/Friend Insurance type: [place]  Medical Screening Exam [facility] Walk-in ONLY) Medical Exam completed: Yes(Spencer Simon, PA)  Crisis Care Plan Living Arrangements: Parent(w/ mother) Name of Psychiatrist: Tamela Oddi at [facility]  Education Status Is patient currently in school?: No Highest grade of school patient has completed: High School Diploma Is the patient employed, unemployed or receiving disability?: Receiving disability income  Risk to self with the past 6 months Suicidal Ideation: Yes-Currently Present Has patient been a risk to self within the past 6 months prior to admission? : Yes Suicidal Intent: Yes-Currently Present Has patient had any suicidal intent within the past 6 months prior to admission? : Yes Is patient at risk for suicide?: Yes Suicidal Plan?: No Has patient had any suicidal plan within the past 6 months prior to admission? : Yes Specify Current Suicidal Plan: None Access to Means: Yes Specify Access to Suicidal Means: could do anything What has been your use of drugs/alcohol within the last 12 months?: Denies Previous Attempts/Gestures: Yes How many times?: 1 Other Self Harm Risks: yes Triggers for Past Attempts: Unpredictable Intentional Self Injurious Behavior: Cutting Comment - Self Injurious Behavior: 05/11/18 Family Suicide History: Unknown Recent stressful life event(s): Conflict (Comment)(Arguments with mother) Persecutory voices/beliefs?: Yes Depression: Yes Depression Symptoms: Despondent, Isolating, Feeling angry/irritable, Feeling worthless/self pity, Insomnia Substance abuse history and/or treatment for substance abuse?: No Suicide prevention information given to non-admitted patients: Not applicable  Risk to Others within the past 6 months Homicidal Ideation: No-Not Currently/Within Last 6 Months Does patient have any lifetime risk of violence toward others beyond the six months prior to  admission? : No Thoughts of Harm to Others: No-Not Currently Present/Within Last 6 Months Comment - Thoughts of Harm to Others: Past thoughts of harm Current Homicidal Intent: No Current Homicidal Plan: No Access to Homicidal Means: No Identified Victim: No one History of harm to others?: No Assessment of Violence: None Noted Violent Behavior Description: None reported Does patient have access to weapons?: No Criminal Charges Pending?: No Does patient have a court date: No Is patient on probation?: No  Psychosis Hallucinations: None noted Delusions: None noted  Mental Status Report Appearance/Hygiene: Disheveled Eye Contact: Fair Motor Activity: Freedom of movement, Restlessness Speech: Argumentative Level of Consciousness: Alert Mood: Anxious, Suspicious Affect: Depressed, Anxious, Irritable Anxiety Level: Severe Thought Processes: Coherent, Relevant Judgement: Impaired Orientation: Person, Place, Situation Obsessive Compulsive Thoughts/Behaviors: None  Cognitive Functioning Concentration: Poor Memory: Recent Impaired, Remote Intact Is patient IDD: No Insight: Poor Impulse Control: Poor Appetite: Poor Have you had any weight changes? : No Change Sleep: Decreased Total Hours of Sleep: 6 Vegetative Symptoms: None  ADLScreening [facility] Assessment Services) Patient's cognitive ability adequate to safely complete daily activities?: Yes Patient able to express need for assistance with ADLs?: Yes Independently performs ADLs?: Yes (appropriate for developmental age)  Prior Inpatient Therapy Prior Inpatient Therapy: Yes Prior Therapy Dates: 05/05-05/11, '  20 [facility] Reason for Treatment: SI/HI  Prior Outpatient Therapy Prior Outpatient Therapy: Yes Prior Therapy Dates: Current Prior Therapy Facilty/Provider(s): [facility], [facility] Reason for Treatment: med management Does patient have an ACCT team?: No Does patient have  Intensive In-House Services?  : No Does patient have [facility] services? : No Does patient have P4CC services?: No  ADL Screening (condition at time of admission) Patient's cognitive ability adequate to safely complete daily activities?: Yes Is the patient deaf or have difficulty hearing?: No Does the patient have difficulty seeing, even when wearing glasses/contacts?: No Does the patient have difficulty concentrating, remembering, or making decisions?: Yes Patient able to express need for assistance with ADLs?: Yes Does the patient have difficulty dressing or bathing?: No Independently performs ADLs?: Yes (appropriate for developmental age) Does the patient have difficulty walking or climbing stairs?: No Weakness of Legs: None Weakness of Arms/Hands: None  Home Assistive Devices/Equipment Home Assistive Devices/Equipment: None    Abuse/Neglect Assessment (Assessment to be complete while patient is alone) Physical Abuse: (Unknown) Verbal Abuse: (Unknown) Sexual Abuse: (Unknown) Exploitation of patient/patient's resources: Denies Self-Neglect: Denies     Merchant navy officerAdvance Directives (For Healthcare) Does Patient Have a Medical Advance Directive?: No Would patient like information on creating a medical advance directive?: No - Patient declined          Disposition:  Disposition Initial Assessment Completed for this Encounter: Yes Disposition of Patient: Admit Type of inpatient treatment program: (Observation unit) Patient refused recommended treatment: No Mode of transportation if patient is discharged/movement?: N/A Patient referred to: Other (Comment)(Obs unit)  On Site Evaluation by:   Reviewed with Physician:    Beatriz StallionHarvey, Millena Callins Ray 05/21/2018 12:23 AM

## 2018-05-21 NOTE — Plan of Care (Signed)
[facility] Observation Crisis Plan  Reason for Crisis Plan:  Crisis Stabilization   Plan of Care:  Referral for Telepsychiatry/Psychiatric Consult  Family Support:      Current Living Environment:  Living Arrangements: Parent  Insurance:   Hospital Account    Name Acct ID Class Status Primary Coverage   Rawlings, Scher [id number] BEHAVIORAL HEALTH OBSERVATION [place]        Guarantor Account (for Hospital Account [id number])    Name Relation to Pt Service Area Active? Acct Type   Corey Skains Self [facility] Yes Behavioral Health   Address Phone       [address] [place], [place] [address] [phone](H)          Coverage Information (for Hospital Account [id number])    F/O Payor/Plan Precert #   [facility] [place]    Subscriber Subscriber #   Gaberial, Bois [id number]   Address Phone   [address] [place], [place] [address] [phone]      Legal Guardian:     Primary Care Provider:  [facility]  Current Outpatient Providers:  Tamela Oddi  Psychiatrist:  Name of Psychiatrist: Tamela Oddi at [facility]  Counselor/Therapist:     Compliant with Medications:  No  Additional Information: pt D/C from [facility] 05/18/2018 , pt stated he did not take his medications because he did not feel like it.    Jacques Navy A 5/14/20201:32 AM

## 2018-05-21 NOTE — Progress Notes (Signed)
Pt admission to observation unit not able to be completed at this time due to pt irritability .

## 2018-05-26 ENCOUNTER — Emergency Department (HOSPITAL_COMMUNITY)
Admission: EM | Admit: 2018-05-26 | Discharge: 2018-05-27 | Disposition: A | Discharge status: Other Acute Inpatient Hospital | Payer: Medicaid Other | Attending: Emergency Medicine | Admitting: Emergency Medicine

## 2018-05-26 ENCOUNTER — Other Ambulatory Visit: Payer: Self-pay

## 2018-05-26 ENCOUNTER — Encounter (HOSPITAL_COMMUNITY): Payer: Self-pay

## 2018-05-26 DIAGNOSIS — R45851 Suicidal ideations: Secondary | ICD-10-CM | POA: Diagnosis not present

## 2018-05-26 DIAGNOSIS — F39 Unspecified mood [affective] disorder: Secondary | ICD-10-CM

## 2018-05-26 DIAGNOSIS — Z79899 Other long term (current) drug therapy: Secondary | ICD-10-CM | POA: Insufficient documentation

## 2018-05-26 DIAGNOSIS — F329 Major depressive disorder, single episode, unspecified: Secondary | ICD-10-CM | POA: Diagnosis present

## 2018-05-26 DIAGNOSIS — E119 Type 2 diabetes mellitus without complications: Secondary | ICD-10-CM | POA: Diagnosis not present

## 2018-05-26 DIAGNOSIS — F332 Major depressive disorder, recurrent severe without psychotic features: Secondary | ICD-10-CM | POA: Insufficient documentation

## 2018-05-26 LAB — COMPREHENSIVE METABOLIC PANEL
ALT: 75 U/L — ABNORMAL HIGH (ref 0–44)
AST: 49 U/L — ABNORMAL HIGH (ref 15–41)
Albumin: 4.7 g/dL (ref 3.5–5.0)
Alkaline Phosphatase: 100 U/L (ref 38–126)
Anion gap: 10 (ref 5–15)
BUN: 7 mg/dL (ref 6–20)
CO2: 23 mmol/L (ref 22–32)
Calcium: 9.5 mg/dL (ref 8.9–10.3)
Chloride: 105 mmol/L (ref 98–111)
Creatinine, Ser: 0.91 mg/dL (ref 0.61–1.24)
GFR calc Af Amer: >60 mL/min (ref >60)
GFR calc non Af Amer: >60 mL/min (ref >60)
Glucose, Bld: 111 mg/dL — ABNORMAL HIGH (ref 70–99)
Potassium: 3.3 mmol/L — ABNORMAL LOW (ref 3.5–5.1)
Sodium: 138 mmol/L (ref 135–145)
Total Bilirubin: 0.5 mg/dL (ref 0.3–1.2)
Total Protein: 8 g/dL (ref 6.5–8.1)

## 2018-05-26 LAB — RAPID URINE DRUG SCREEN, HOSP PERFORMED
Amphetamines: NOT DETECTED
Barbiturates: NOT DETECTED
Benzodiazepines: NOT DETECTED
Cocaine: NOT DETECTED
Opiates: NOT DETECTED
Tetrahydrocannabinol: NOT DETECTED

## 2018-05-26 LAB — CBC WITH DIFFERENTIAL/PLATELET
Abs Immature Granulocytes: 0.03 10*3/uL (ref 0.00–0.07)
Basophils Absolute: 0 10*3/uL (ref 0.0–0.1)
Basophils Relative: 0 %
Eosinophils Absolute: 0 10*3/uL (ref 0.0–0.5)
Eosinophils Relative: 0 %
HCT: 42.4 % (ref 39.0–52.0)
Hemoglobin: 13.9 g/dL (ref 13.0–17.0)
Immature Granulocytes: 0 %
Lymphocytes Relative: 28 %
Lymphs Abs: 2.5 10*3/uL (ref 0.7–4.0)
MCH: 29.6 pg (ref 26.0–34.0)
MCHC: 32.8 g/dL (ref 30.0–36.0)
MCV: 90.4 fL (ref 80.0–100.0)
Monocytes Absolute: 0.9 10*3/uL (ref 0.1–1.0)
Monocytes Relative: 9 %
Neutro Abs: 5.6 10*3/uL (ref 1.7–7.7)
Neutrophils Relative %: 63 %
Platelets: 293 10*3/uL (ref 150–400)
RBC: 4.69 MIL/uL (ref 4.22–5.81)
RDW: 14.6 % (ref 11.5–15.5)
WBC: 9 10*3/uL (ref 4.0–10.5)
nRBC: 0 % (ref 0.0–0.2)

## 2018-05-26 LAB — BLOOD GAS, VENOUS
Acid-Base Excess: 2.3 mmol/L — ABNORMAL HIGH (ref 0.0–2.0)
Bicarbonate: 26.9 mmol/L (ref 20.0–28.0)
FIO2: 21
O2 Saturation: 63.8 %
Patient temperature: 98.6
pCO2, Ven: 43.8 mmHg — ABNORMAL LOW (ref 44.0–60.0)
pH, Ven: 7.406 (ref 7.250–7.430)
pO2, Ven: 35 mmHg (ref 32.0–45.0)

## 2018-05-26 LAB — PROTIME-INR
INR: 1 (ref 0.8–1.2)
Prothrombin Time: 13.3 seconds (ref 11.4–15.2)

## 2018-05-26 LAB — SALICYLATE LEVEL: Salicylate Lvl: <7 mg/dL (ref 2.8–30.0)

## 2018-05-26 LAB — ACETAMINOPHEN LEVEL
Acetaminophen (Tylenol), Serum: <10 ug/mL — ABNORMAL LOW (ref 10–30)
Acetaminophen (Tylenol), Serum: <10 ug/mL — ABNORMAL LOW (ref 10–30)

## 2018-05-26 LAB — ETHANOL: Alcohol, Ethyl (B): <10 mg/dL (ref <10)

## 2018-05-26 MED ORDER — HALOPERIDOL LACTATE 5 MG/ML IJ SOLN
INTRAMUSCULAR | Status: AC
  Administered 2018-05-26: 20:00:00
  Filled 2018-05-26: qty 1

## 2018-05-26 MED ORDER — LORAZEPAM 2 MG/ML IJ SOLN
INTRAMUSCULAR | Status: AC
  Administered 2018-05-26: 2 mg
  Filled 2018-05-26: qty 1

## 2018-05-26 MED ORDER — LORAZEPAM 2 MG/ML IJ SOLN
2.0000 mg | Freq: Once | INTRAMUSCULAR | Status: DC
  Filled 2018-05-26: qty 1

## 2018-05-26 MED ORDER — DIPHENHYDRAMINE HCL 50 MG/ML IJ SOLN
INTRAMUSCULAR | Status: AC
  Administered 2018-05-26: 20:00:00
  Filled 2018-05-26: qty 1

## 2018-05-26 MED ORDER — ONDANSETRON HCL 4 MG PO TABS
4.0000 mg | ORAL_TABLET | Freq: Once | ORAL | Status: AC
  Administered 2018-05-26: 4 mg via ORAL
  Filled 2018-05-26: qty 1

## 2018-05-26 MED ORDER — ONDANSETRON HCL 4 MG PO TABS: 4.0000 mg | ORAL_TABLET | Freq: Once | ORAL | Status: DC

## 2018-05-26 MED ORDER — DIPHENHYDRAMINE HCL 50 MG/ML IJ SOLN: 50.0000 mg | Freq: Once | INTRAMUSCULAR | Status: DC

## 2018-05-26 MED ORDER — HALOPERIDOL 5 MG PO TABS
5.0000 mg | ORAL_TABLET | Freq: Once | ORAL | Status: AC
  Administered 2018-05-26: 5 mg via ORAL
  Filled 2018-05-26: qty 1

## 2018-05-26 MED ORDER — POTASSIUM CHLORIDE CRYS ER 20 MEQ PO TBCR
40.0000 meq | EXTENDED_RELEASE_TABLET | Freq: Once | ORAL | Status: AC
  Administered 2018-05-26: 40 meq via ORAL
  Filled 2018-05-26: qty 2

## 2018-05-26 MED ORDER — HALOPERIDOL LACTATE 5 MG/ML IJ SOLN
5.0000 mg | Freq: Once | INTRAMUSCULAR | Status: DC
  Filled 2018-05-26: qty 1

## 2018-05-26 MED ORDER — ONDANSETRON HCL 4 MG/2ML IJ SOLN
4.0000 mg | Freq: Once | INTRAMUSCULAR | Status: AC
  Administered 2018-05-26: 4 mg via INTRAVENOUS
  Filled 2018-05-26: qty 2

## 2018-05-26 MED ORDER — LORAZEPAM 2 MG/ML IJ SOLN: 2.0000 mg | Freq: Once | INTRAMUSCULAR | Status: DC

## 2018-05-26 MED ORDER — HALOPERIDOL LACTATE 5 MG/ML IJ SOLN: 10.0000 mg | Freq: Once | INTRAMUSCULAR | Status: DC

## 2018-05-26 MED ORDER — LORAZEPAM 1 MG PO TABS
2.0000 mg | ORAL_TABLET | Freq: Once | ORAL | Status: AC
  Administered 2018-05-26: 2 mg via ORAL
  Filled 2018-05-26: qty 2

## 2018-05-26 NOTE — ED Notes (Signed)
Bed: WA20 Expected date:  Expected time:  Means of arrival:  Comments: EMS-OD 

## 2018-05-26 NOTE — ED Provider Notes (Signed)
Care assumed from Dr. Madilyn Hook.  At time of transfer care, patient is awaiting 6-hour monitoring which will be completed at 1630.  After this, he will be medically cleared.  Patient also has a second Tylenol level awaiting completion.  Patient was given Ativan for agitation but is resting calmly at time of transfer care.  Awaiting TTS recommendations.  8:27 PM Nursing reports that patient is likely placed at [facility] tomorrow morning.  Patient began increasingly agitated and was physically threatening staff.  Patient was given IM medications to help him calm down for safety of himself and others.  Patient was given similar dose to medications that were needed during recent stay in the [facility] emergency department.  Patient also was placed in restraints for violence.  I examined patient after he calmed down and his lungs were clear and breath sounds were equal.  Had restraints begun to be removed.  Patient currently calm after sedation.  Patient's second Tylenol level was negative.  He was monitored for the required period of time and he is now medically clear.  Patient awaiting psychiatry placement.  He is under IVC currently.    Tegeler, Canary Brim, MD 05/26/18 2236

## 2018-05-26 NOTE — ED Notes (Signed)
Pt says, "Fucking bitch, I'll fuck your ass up" because I cannot give him something else for nausea.  Continues to call this Clinical research associate a fucking bitch.Marland Kitchen

## 2018-05-26 NOTE — ED Notes (Signed)
Pt will not stay in his room and tries to stand right in front of this Clinical research associate. It is intimidating to get my work done. He takes his mask down and then gets angry when this writer asks him to replace it. He got angry when he asked for something else for nausea and this writer told him that it would be awhile because he just got Zofran 4 mg IVP.

## 2018-05-26 NOTE — ED Notes (Signed)
[place], NA at bedside for Recruitment consultant. Pt very talkative at this time due does appear slightly drowsy. Pt states that he's seeing people and he is hot all over. Pt complaining of nausea but has not vomiting since being in the ED. Pt's personal clothing was taken away by Daphine Deutscher, NA and placed into burgundy scrubs. Pt is cooperative at this time.

## 2018-05-26 NOTE — ED Notes (Addendum)
Pt had to be put in restraints at 1950 due to refusing to listen to security and staff to stay in his room. Pt is currently in bed sleeping.

## 2018-05-26 NOTE — ED Notes (Signed)
Report called to Diane, RN in [place]. Pt will be transported over to room 29 with RN and NA. Pt's IV will remain in place for time being and pt will remain on cardiac monitor. All belongings were sent to TCU and given to Diane.

## 2018-05-26 NOTE — ED Notes (Signed)
Patient agitated, using profanity, making treats to TCU RN-writer discussed options with patient-patient was compliant with PO meds from writer-patient's room was moved away from nursing desk-patient voiced his gratitude-sitter at bedside-patient calm and copperative

## 2018-05-26 NOTE — ED Provider Notes (Signed)
[facility]-EMERGENCY DEPT Provider Note   CSN: [id number] Arrival date & time: 05/26/18  1030    History   Chief Complaint Chief Complaint  Patient presents with  . Psychiatric Evaluation    HPI Logan Harper is a 23 y.o. male.     The history is provided by the patient, medical records, a parent and the EMS personnel. No language interpreter was used.   Logan Harper is a 23 y.o. male who presents to the Emergency Department complaining of overdose. He presents the emergency department by EMS following overdose. He states that he got in an argument with his mother and became angry and agitated. He started throwing things and he took pills in an attempt to harm himself. He complains of ongoing agitation and suicidal ideation. He states that he took his home medications as well as an additional white pill that he does not know what it was. He also states that he drank two bottles of Pepto-Bismol. Per EMS he threw up for police and he dry heaved in the car. Patient denies any nausea.   Additional history available from the patient's mother after his initial ED arrival. She states that he was mad this morning after she asked him to pick up. He has been throwing things. She states that his medications are hidden and he did not have access to them until she provided them this morning. He took propranolol 40 mg, Neurontin 600 mg, Lexapro 20 mg, iron 325, multivitamin for men. She is unsure what the additional big white pill list that he took but believes it could've been melatonin or Lexapro. She states that he did have a few groups from a Pepto-Bismol bottle but he did not drink two full bottles of Pepto-Bismol. He has been experiencing multiple outbursts and similar behavior over the last few weeks and has had episodes in the past as well. She states that his medications have been adjusted recently and during these adjustments he has had increased behavioral outbursts.   Past Medical History:  Diagnosis Date  . Anxiety   . Depression   . Diabetes mellitus without complication (HCC)   . Headache(784.0)   . Obesity   . Suicidal ideation     Patient Active Problem List   Diagnosis Date Noted  . MDD (major depressive disorder), recurrent episode, severe (HCC) 05/20/2018  . MDD (major depressive disorder), recurrent, severe, with psychosis (HCC) 05/12/2018  . Conduct disorder, childhood-onset type, severe   . Suicidal ideation   . GAD (generalized anxiety disorder) 09/08/2012  . Fast heart beat 04/27/2012  . MDD (major depressive disorder), recurrent episode, moderate (HCC) 11/21/2011  . Conduct disorder, childhood-onset type 11/21/2011  . ADHD (attention deficit hyperactivity disorder), combined type 11/21/2011    Past Surgical History:  Procedure Laterality Date  . NO PAST SURGERIES          Home Medications    Prior to Admission medications   Medication Sig Start Date End Date Taking? Authorizing Provider  ARIPiprazole ER (ABILIFY MAINTENA) 400 MG SRER injection Inject 2 mLs (400 mg total) into the muscle every 28 (twenty-eight) days. Due 6/7 Show to doctor- no need to fill 06/11/18  Yes Malvin JohnsFarah, Brian, MD  bismuth subsalicylate (PEPTO BISMOL) 262 MG/15ML suspension Take 30 mLs by mouth every 6 (six) hours as needed for indigestion.   Yes [provider]  escitalopram (LEXAPRO) 20 MG tablet Take 20 mg by mouth daily.   Yes [provider]  ferrous sulfate 325 (  65 FE) MG tablet 1 every other day Patient taking differently: Take 325 mg by mouth daily with breakfast.  05/18/18  Yes Malvin Johns, MD  gabapentin (NEURONTIN) 600 MG tablet Take 1 tablet (600 mg total) by mouth 3 (three) times daily. 05/18/18  Yes Malvin Johns, MD  ibuprofen (ADVIL) 200 MG tablet Take 400 mg by mouth every 6 (six) hours as needed for headache or moderate pain.   Yes [provider]  loratadine (CLARITIN) 10 MG tablet Take 1 tablet (10 mg total)  by mouth every evening. 05/18/18  Yes Malvin Johns, MD  Multiple Vitamin (MULTIVITAMIN WITH MINERALS) TABS tablet Take 1 tablet by mouth daily.   Yes [provider]  omeprazole (PRILOSEC) 40 MG capsule Take 40 mg by mouth daily.   Yes [provider]  propranolol (INDERAL) 40 MG tablet Take 40 mg by mouth 2 (two) times daily.   Yes [provider]  escitalopram (LEXAPRO) 10 MG tablet Take 1 tablet (10 mg total) by mouth daily. Patient not taking: Reported on 05/26/2018 05/19/18   Malvin Johns, MD  omeprazole (PRILOSEC OTC) 20 MG tablet Take 1 tablet (20 mg total) by mouth daily. Patient not taking: Reported on 05/26/2018 05/18/18 05/18/19  Malvin Johns, MD  propranolol (INDERAL) 20 MG tablet Take 2 tablets (40 mg total) by mouth 2 (two) times daily. Patient not taking: Reported on 05/26/2018 12/10/13   Court Joy, PA-C    Family History History reviewed. No pertinent family history.  Social History Social History   Tobacco Use  . Smoking status: Never Smoker  . Smokeless tobacco: Never Used  Substance Use Topics  . Alcohol use: No  . Drug use: No     Allergies   Patient has no known allergies.   Review of Systems Review of Systems  All other systems reviewed and are negative.    Physical Exam Updated Vital Signs BP 136/82   Pulse 69   Temp 98.6 F (37 C) (Oral)   Resp (!) 21   Ht 6\' 2"  (1.88 m)   Wt 90.7 kg   SpO2 96%   BMI 25.68 kg/m   Physical Exam Vitals signs and nursing note reviewed.  Constitutional:      Appearance: He is well-developed.  HENT:     Head: Normocephalic and atraumatic.  Cardiovascular:     Rate and Rhythm: Normal rate and regular rhythm.  Pulmonary:     Effort: Pulmonary effort is normal.     Breath sounds: Normal breath sounds.  Abdominal:     Palpations: Abdomen is soft.     Tenderness: There is no abdominal tenderness. There is no guarding or rebound.  Musculoskeletal:        General: No tenderness.   Skin:    General: Skin is warm and dry.  Neurological:     Mental Status: He is alert and oriented to person, place, and time.  Psychiatric:     Comments: Mildly agitated, ongoing suicidal thoughts, poor eye contact       ED Treatments / Results  Labs (all labs ordered are listed, but only abnormal results are displayed) Labs Reviewed  COMPREHENSIVE METABOLIC PANEL - Abnormal; Notable for the following components:      Result Value   Potassium 3.3 (*)    Glucose, Bld 111 (*)    AST 49 (*)    ALT 75 (*)    All other components within normal limits  ACETAMINOPHEN LEVEL - Abnormal; Notable for the  following components:   Acetaminophen (Tylenol), Serum <10 (*)    All other components within normal limits  BLOOD GAS, VENOUS - Abnormal; Notable for the following components:   pCO2, Ven 43.8 (*)    Acid-Base Excess 2.3 (*)    All other components within normal limits  CBC WITH DIFFERENTIAL/PLATELET  SALICYLATE LEVEL  ETHANOL  PROTIME-INR  RAPID URINE DRUG SCREEN, HOSP PERFORMED  ACETAMINOPHEN LEVEL    EKG EKG Interpretation  Date/Time:  Tuesday May 26 2018 10:57:40 EDT Ventricular Rate:  80 PR Interval:    QRS Duration: 106 QT Interval:  394 QTC Calculation: 455 R Axis:   59 Text Interpretation:  Sinus rhythm No significant change since last tracing Confirmed by Tilden Fossa [phone]) on 05/26/2018 1:37:54 PM   Radiology No results found.  Procedures Procedures (including critical care time)  Medications Ordered in ED Medications  ondansetron (ZOFRAN) injection 4 mg (4 mg Intravenous Given 05/26/18 1143)  potassium chloride SA (K-DUR) CR tablet 40 mEq (40 mEq Oral Given 05/26/18 1143)  ondansetron (ZOFRAN) injection 4 mg (4 mg Intravenous Given 05/26/18 1355)     Initial Impression / Assessment and Plan / ED Course  I have reviewed the triage vital signs and the nursing notes.  Pertinent labs & imaging results that were available during my care of the patient  were reviewed by me and considered in my medical decision making (see chart for details).        Patient brought into the emergency department following suicide attempt by overdose. He complains of being agitated but is calm on ED arrival. Initial acetaminophen and salicylate levels are negative. Patient reports ingesting  more medications that mom believes he ingested. Per poison control recommendations we will observe for six hours from ED presentation. Patient did develop increased agitation again during his ED stay but could be redirected. IVC papers completed for patient safety. Patient care transferred pending repeat acetaminophen level, TTS evaluation.  Final Clinical Impressions(s) / ED Diagnoses   Final diagnoses:  None    ED Discharge Orders    None       Tilden Fossa, MD 05/26/18 1404

## 2018-05-26 NOTE — ED Notes (Signed)
Released from last two restraints due to ongoing calm and asleep. Does not respond to his name being called. Breathing evenly. Sitter present at bedside.

## 2018-05-26 NOTE — ED Notes (Signed)
Pt ambulatory to the bathroom and back with Logan Harper, Logan Harper. Pt placed back on cardiac monitor.

## 2018-05-26 NOTE — ED Notes (Signed)
Recommendations from Poison Control  Melatonin- drowsy Lexapro- nausea and vomiting, drowsiness Pepto- tylenol, ASA, level, CMP, EKG  Cardiac monitor and oral hydration. Observation of 6 hours. If ASA is measurable, will need to be redrawn in 3 hours.

## 2018-05-26 NOTE — ED Notes (Signed)
Pt has refused to wear the cardiac monitor: He removed it himself. . Dr. Madilyn Hook informed and ok with with that.  Requested medicine for nausea and that was given.  Pt currently cooperative. In his room. Comes to the door often. Wears mask as requested whe he leaves the room.

## 2018-05-26 NOTE — ED Notes (Signed)
Pt responds well to male security and police officers.  Continuously attention seeking.

## 2018-05-26 NOTE — ED Notes (Signed)
Pt currently calm and cooperative.  

## 2018-05-26 NOTE — ED Notes (Signed)
Restraint in 4 points at 1950 for aggression. Banged his head against the wall, continues to curse and defy security and staff, not following directions, calling staff "bitch" repeatedly. Had agreed few minutes earlier to calm when faced with being restrained but unable to remain calm. Had given him IM meds to help him calm and gain control but not effective to this point. Charge nurse notified and came to help along with four security officers. Sitter at bedside to monitor for safety.

## 2018-05-26 NOTE — ED Notes (Signed)
Brant with Poison Control cleared pt.

## 2018-05-26 NOTE — ED Notes (Signed)
Released from R wrist and L ankle due to him being calm and sleeping. Dr Rush Landmark on unit to assess him. Remains asleep. Will continue to monitor. Sitter at bedside.

## 2018-05-26 NOTE — ED Triage Notes (Signed)
Pt coming in via [facility] for a suspected overdose. EMS reports pt called 911 asking for police due to seeking help for suicidal ideations. Pt reports to PD that before they arrived he took several pills (unknown of name) and then vomited. Pt then reports he drank a large amount of pepto-bisal Pt reports this was in an attempt to harm himself. Pt denies HI.

## 2018-05-26 NOTE — Progress Notes (Signed)
Pt meets inpatient criteria per Denzil Magnuson, NP. Referral information has been sent to the following hospitals for review:  [facility]  [facility]  [facility]  [facility]  [facility]  [facility]   Disposition will continue to assist with inpatient placement needs.   Wells Guiles, LCSW, LCAS Disposition CSW [facility]S [phone]

## 2018-05-26 NOTE — ED Notes (Signed)
As shift change he is lying on the floor with a security officer present testing limits, cursing, threatening and at one point made his way to the exit which is not locked but did respond to security and returned to his room. Called Dr Rush Landmark for orders and he gave them and they were administered as ordered. This Clinical research associate had him as a patient at [facility] two weeks ago with same behavior. It took multiple meds and restraints before he calmed down. No noted psychosis, behavior issues.

## 2018-05-26 NOTE — BH Assessment (Addendum)
Tele Assessment Note   Patient Name: Logan Harper MRN: [id number] Referring Physician: Madilyn Hook Location of Patient: [facility] ED Location of Provider: Behavioral Health TTS Department  Flemon Trobaugh is an 23 y.o. male.  The pt came in and stated he overdosed on 6 pills.  The pt's mother stated the pt didn't take any extra pills today, because she keeps the pills hidden.  The pt also stated he drunk 2 bottle of pepto-bismal  The pt's mother stated he took "3-4 gulps" of it.  The pt stated he continues to be suicidal and stressed about "life".  His mother stated, she asked the pt to clean his area.  The pt started throwing things around the room and refused to let his mother leave the room.  The pt denies any previous suicide attempts.  He goes to [facility].  He was last inpatient 05/12/2018.  The pt lives with his mother and they have a history of arguments.  He has a history of cutting and last cut 2 weeks ago.  He denies legal issues, and a history of abuse.  The pt reports seeing people and this is new for him.  He reports sleeping and eating well.  He denies SA.  Pt is dressed in scrubs. He is alert and oriented x4. Pt speaks in a clear tone, at moderate volume and normal pace. Eye contact is good. Pt's mood is depressed. Thought process is coherent and relevant. There is no indication Pt is currently responding to internal stimuli or experiencing delusional thought content.?Pt was cooperative throughout assessment.    Diagnosis: F33.2 Major depressive disorder, Recurrent episode, Severe   Past Medical History:  Past Medical History:  Diagnosis Date  . Anxiety   . Depression   . Diabetes mellitus without complication (HCC)   . Headache(784.0)   . Obesity   . Suicidal ideation     Past Surgical History:  Procedure Laterality Date  . NO PAST SURGERIES      Family History: History reviewed. No pertinent family history.  Social History:  reports that he has never smoked. He has  never used smokeless tobacco. He reports that he does not drink alcohol or use drugs.  Additional Social History:  Alcohol / Drug Use Pain Medications: See MAR Prescriptions: See MAR Over the Counter: See MAR History of alcohol / drug use?: No history of alcohol / drug abuse Longest period of sobriety (when/how long): NA  CIWA: CIWA-Ar BP: 136/82 Pulse Rate: 69 COWS:    Allergies: No Known Allergies  Home Medications: (Not in a hospital admission)   OB/GYN Status:  No LMP for male patient.  General Assessment Data Location of Assessment: [facility] ED TTS Assessment: In system Is this a Tele or Face-to-Face Assessment?: Tele Assessment Is this an Initial Assessment or a Re-assessment for this encounter?: Initial Assessment Patient Accompanied by:: N/A Language Other than English: No Living Arrangements: Other (Comment)(home) What gender do you identify as?: Male Marital status: Single Living Arrangements: Parent Can pt return to current living arrangement?: Yes Admission Status: Voluntary Is patient capable of signing voluntary admission?: Yes Referral Source: Self/Family/Friend Insurance type: Medicaid     Crisis Care Plan Living Arrangements: Parent Legal Guardian: Other:(Self) Name of Psychiatrist: Tamela Oddi at [facility] Name of Therapist: none  Education Status Is patient currently in school?: No Highest grade of school patient has completed: High School Diploma Is the patient employed, unemployed or receiving disability?: Unemployed  Risk to self with the past 6 months Suicidal  Ideation: Yes-Currently Present Has patient been a risk to self within the past 6 months prior to admission? : Yes Suicidal Intent: Yes-Currently Present Has patient had any suicidal intent within the past 6 months prior to admission? : Yes Is patient at risk for suicide?: Yes Suicidal Plan?: Yes-Currently Present Has patient had any suicidal plan within the past 6 months prior to  admission? : Yes Specify Current Suicidal Plan: overdose on pills Access to Means: No What has been your use of drugs/alcohol within the last 12 months?: none Previous Attempts/Gestures: No How many times?: 0 Other Self Harm Risks: cutting Triggers for Past Attempts: None known Intentional Self Injurious Behavior: Cutting Comment - Self Injurious Behavior: cutting Family Suicide History: No Recent stressful life event(s): Conflict (Comment)(arguemnt with mother) Persecutory voices/beliefs?: No Depression: Yes Depression Symptoms: Despondent Substance abuse history and/or treatment for substance abuse?: No Suicide prevention information given to non-admitted patients: Not applicable  Risk to Others within the past 6 months Homicidal Ideation: No Does patient have any lifetime risk of violence toward others beyond the six months prior to admission? : No Thoughts of Harm to Others: No Comment - Thoughts of Harm to Others: NA Current Homicidal Intent: No Current Homicidal Plan: No Access to Homicidal Means: No Identified Victim: pt denies History of harm to others?: No Assessment of Violence: On admission Violent Behavior Description: wouldn't let mother leave the room Does patient have access to weapons?: No Criminal Charges Pending?: No Does patient have a court date: No Is patient on probation?: No  Psychosis Hallucinations: Visual Delusions: None noted  Mental Status Report Appearance/Hygiene: Unremarkable Eye Contact: Fair Motor Activity: Freedom of movement, Unremarkable Speech: Logical/coherent Level of Consciousness: Alert Mood: Depressed Affect: Depressed Anxiety Level: None Thought Processes: Coherent, Relevant Judgement: Impaired Orientation: Person, Place, Time, Situation Obsessive Compulsive Thoughts/Behaviors: None  Cognitive Functioning Concentration: Normal Memory: Recent Intact, Remote Intact Is patient IDD: No(unknown) Insight: Poor Impulse  Control: Poor Appetite: Good Have you had any weight changes? : No Change Sleep: No Change Total Hours of Sleep: 8 Vegetative Symptoms: None  ADLScreening [facility] Assessment Services) Patient's cognitive ability adequate to safely complete daily activities?: Yes Patient able to express need for assistance with ADLs?: Yes Independently performs ADLs?: Yes (appropriate for developmental age)  Prior Inpatient Therapy Prior Inpatient Therapy: Yes Prior Therapy Dates: 05/05-05/11, '20 [facility] Reason for Treatment: SI/HI  Prior Outpatient Therapy Prior Outpatient Therapy: Yes Prior Therapy Dates: Current Prior Therapy Facilty/Provider(s): [facility], [facility] Reason for Treatment: med management Does patient have an ACCT team?: No Does patient have Intensive In-House Services?  : No Does patient have [facility] services? : No Does patient have P4CC services?: No  ADL Screening (condition at time of admission) Patient's cognitive ability adequate to safely complete daily activities?: Yes Patient able to express need for assistance with ADLs?: Yes Independently performs ADLs?: Yes (appropriate for developmental age)       Abuse/Neglect Assessment (Assessment to be complete while patient is alone) Abuse/Neglect Assessment Can Be Completed: Yes Physical Abuse: Denies Verbal Abuse: Denies Sexual Abuse: Denies Exploitation of patient/patient's resources: Denies Self-Neglect: Denies Values / Beliefs Cultural Requests During Hospitalization: None Spiritual Requests During Hospitalization: None Consults Spiritual Care Consult Needed: No Social Work Consult Needed: No Merchant navy officerAdvance Directives (For Healthcare) Does Patient Have a Medical Advance Directive?: No          Disposition:  Disposition Initial Assessment Completed for this Encounter: Yes  NP Denzil MagnusonLashunda Thomas recommends the pt be  inpatient.  RN Sedalia Muta and MD Madilyn Hook were made aware of the  recommendations.  This service was provided via telemedicine using a 2-way, interactive audio and video technology.  Names of all persons participating in this telemedicine service and their role in this encounter. Name: Braylen Staller Role: Pt  Name:  Role:   Name:  Role:   Name:  Role:     Ottis Stain 05/26/2018 2:50 PM

## 2018-05-26 NOTE — ED Notes (Addendum)
Pt is rolling on the floor, verbally aggressive towards staff, walking out of his room and refusing to stay in his room. Security outside of door.

## 2018-05-26 NOTE — Progress Notes (Signed)
Pt accepted to [facility]; main campus Dr. Estill Cotta is the accepting/attending provider.   Call report to [phone] Fannie Knee @ [facility] ED notified.    Pt is involuntary and will be transported by law enforcement.  Pt is scheduled to arrive at [facility] on 05/27/18 after 8am.   Wells Guiles, LCSW, LCAS Disposition CSW [facility]/TTS [phone]

## 2018-05-26 NOTE — ED Notes (Signed)
CLOTHING IN HOSPITAL BAG IN LOCKER [phone]

## 2018-05-27 ENCOUNTER — Encounter (HOSPITAL_COMMUNITY): Payer: Self-pay | Admitting: Registered Nurse

## 2018-05-27 LAB — CBG MONITORING, ED: Glucose-Capillary: 81 mg/dL (ref 70–99)

## 2018-05-27 NOTE — ED Notes (Signed)
Telephone call from TTS to inform writer patient has been accepted to [facility] in am, sheriff to transport.

## 2018-05-27 NOTE — ED Notes (Signed)
Pt DCd off unit to [facility] per MD order. Pt alert, calm, cooperative, anxious, no s/s of distress. Pt DC information given to sheriff for [facility]. Pt belongings given to sheriff for [facility]. Pt ambulatory off unit with sheriff. Pt transported by sheriff.

## 2018-05-27 NOTE — Discharge Summary (Signed)
  Patient being transferred to [facility] for inpatient psychiatric treatment.   Shuvon B. Rankin, NP

## 2018-05-27 NOTE — Progress Notes (Signed)
Received Lexus in his room asleep with the sitter at the bedside. No acute distress noted at his time. He slept throughout the night and woke up at 0600 hrs in a good mood.

## 2018-05-27 NOTE — ED Notes (Signed)
Pt woke up very calm and cooperative. Pt just wanted to know if he could have nausea medication.

## 2018-06-09 ENCOUNTER — Ambulatory Visit (HOSPITAL_COMMUNITY)
Admission: RE | Admit: 2018-06-09 | Discharge: 2018-06-09 | Disposition: A | Discharge status: Home / Self Care | Payer: Medicaid Other | Attending: Psychiatry | Admitting: Psychiatry

## 2018-06-09 DIAGNOSIS — R45851 Suicidal ideations: Secondary | ICD-10-CM | POA: Insufficient documentation

## 2018-06-09 DIAGNOSIS — F332 Major depressive disorder, recurrent severe without psychotic features: Secondary | ICD-10-CM | POA: Insufficient documentation
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
## 2018-06-09 NOTE — BH Assessment (Addendum)
Assessment Note  Logan Harper is a single 23 y.o. male who presents voluntarily to [facility] via EMS for assessment.   Pt is reporting symptoms of anxiety with suicidal ideation. Pt was discharged from [facility] 5 days ago. He also was inpt at [facility] twice in May 2020.  Pt reports medication compliance. Pt reports current suicidal ideation without plan after getting frustrated at home due to feeling "cooped up". He denies past suicide attempts. Pt acknowledges some symptoms of Depression. Pt denies homicidal ideation/ history of violence (aside from throwing things). Pt denies AVH & other sx of psychosis. Pt states current stressors include isolating due to pandemic & living in motel room with mother. Pt reports his mother as a support for him. Pt denies hx of abuse and trauma. Pt has fair insight and judgment. Pt's memory is intact. He has no current legal charges. Pt gave verbal permission to speak with his mother. Mother was contacted by phone. She reports this episode seemed to come "out of nowhere".  Mother is supportive of pt being discharged home at this time. ? Pt's OP history includes Tamela Oddi, NP at [facility]. IP history includes multiple recent admissions. Pt denies alcohol/ substance abuse. ? MSE: Pt is casually dressed, alert, oriented x4 with normal speech and normal motor behavior. Eye contact is good. Pt's mood is apprehensive and pleasant. Affect is apprehensive. Affect is congruent with mood. Thought process is coherent and relevant. There is no indication pt is currently responding to internal stimuli or experiencing delusional thought content. Pt was cooperative throughout assessment.   Diagnosis: MDD (major depressive disorder), recurrent episode, severe Disposition: Shuvon Rankin, NP recommends discharge home & follow up with outpt tx   Past Medical History:  Past Medical History:  Diagnosis Date  . Anxiety   . Depression   . Diabetes mellitus without  complication (HCC)   . Headache(784.0)   . Obesity   . Suicidal ideation     Past Surgical History:  Procedure Laterality Date  . NO PAST SURGERIES      Family History: No family history on file.  Social History:  reports that he has never smoked. He has never used smokeless tobacco. He reports that he does not drink alcohol or use drugs.  Additional Social History:  Alcohol / Drug Use Pain Medications: See MAR Prescriptions: See MAR Over the Counter: See MAR History of alcohol / drug use?: No history of alcohol / drug abuse Longest period of sobriety (when/how long): NA  CIWA: CIWA-Ar BP: 127/71 Pulse Rate: (!) 101 COWS:    Allergies: No Known Allergies  Home Medications: (Not in a hospital admission)   OB/GYN Status:  No LMP for male patient.  General Assessment Data Location of Assessment: [facility] Services TTS Assessment: In system Is this a Tele or Face-to-Face Assessment?: Face-to-Face Is this an Initial Assessment or a Re-assessment for this encounter?: Initial Assessment Patient Accompanied by:: N/A Language Other than English: No Living Arrangements: Other (Comment) What gender do you identify as?: Male Marital status: Single Living Arrangements: Parent(in motel room) Can pt return to current living arrangement?: Yes Admission Status: Voluntary Is patient capable of signing voluntary admission?: Yes Referral Source: Self/Family/Friend Insurance type: medicaid  Medical Screening Exam [facility] Walk-in ONLY) Medical Exam completed: Yes  Crisis Care Plan Living Arrangements: Parent(in motel room) Name of Psychiatrist: Tamela Oddi at [facility] Name of Therapist: none  Education Status Is patient currently in school?: No Highest grade of school  patient has completed: High School Diploma Is the patient employed, unemployed or receiving disability?: Unemployed, Receiving disability income  Risk to self with the past 6 months Suicidal Ideation:  Yes-Currently Present Has patient been a risk to self within the past 6 months prior to admission? : Yes Suicidal Intent: No-Not Currently/Within Last 6 Months Has patient had any suicidal intent within the past 6 months prior to admission? : Yes Is patient at risk for suicide?: Yes Suicidal Plan?: No Has patient had any suicidal plan within the past 6 months prior to admission? : Yes What has been your use of drugs/alcohol within the last 12 months?: none Previous Attempts/Gestures: No How many times?: 0 Other Self Harm Risks: denies Triggers for Past Attempts: None known Intentional Self Injurious Behavior: None Family Suicide History: No Recent stressful life event(s): Other (Comment)("feeling cooped up") Persecutory voices/beliefs?: No Depression: Yes Depression Symptoms: Isolating, Loss of interest in usual pleasures, Feeling worthless/self pity, Guilt Substance abuse history and/or treatment for substance abuse?: No Suicide prevention information given to non-admitted patients: Not applicable  Risk to Others within the past 6 months Homicidal Ideation: No Does patient have any lifetime risk of violence toward others beyond the six months prior to admission? : Yes (comment)(while on unit- previous admission) Thoughts of Harm to Others: No Current Homicidal Intent: No Current Homicidal Plan: No Access to Homicidal Means: No Identified Victim: pt denies History of harm to others?: No Assessment of Violence: In past 6-12 months Violent Behavior Description: throwing things Does patient have access to weapons?: No Criminal Charges Pending?: No Does patient have a court date: No Is patient on probation?: No  Psychosis Hallucinations: None noted Delusions: None noted  Mental Status Report Appearance/Hygiene: Unremarkable Eye Contact: Good Motor Activity: Freedom of movement Speech: Logical/coherent Level of Consciousness: Alert Mood: Apprehensive, Pleasant Affect:  Constricted, Apprehensive Anxiety Level: Minimal Thought Processes: Coherent, Relevant Judgement: Unimpaired Orientation: Person, Place, Time, Situation Obsessive Compulsive Thoughts/Behaviors: None  Cognitive Functioning Concentration: Good Memory: Recent Intact, Remote Intact Is patient IDD: Yes Insight: Fair Impulse Control: Fair Appetite: Fair Have you had any weight changes? : No Change Sleep: No Change Total Hours of Sleep: 5 Vegetative Symptoms: None  ADLScreening [facility] Assessment Services) Patient's cognitive ability adequate to safely complete daily activities?: Yes Patient able to express need for assistance with ADLs?: Yes Independently performs ADLs?: Yes (appropriate for developmental age)  Prior Inpatient Therapy Prior Inpatient Therapy: Yes Prior Therapy Dates: 05/05-05/11, '20 [facility] Reason for Treatment: SI/HI  Prior Outpatient Therapy Prior Outpatient Therapy: Yes Prior Therapy Dates: Current Prior Therapy Facilty/Provider(s): [facility], [facility] Reason for Treatment: med management Does patient have an ACCT team?: No Does patient have Intensive In-House Services?  : No Does patient have [facility] services? : No Does patient have P4CC services?: No  ADL Screening (condition at time of admission) Patient's cognitive ability adequate to safely complete daily activities?: Yes Is the patient deaf or have difficulty hearing?: No Does the patient have difficulty seeing, even when wearing glasses/contacts?: No Does the patient have difficulty concentrating, remembering, or making decisions?: Yes Patient able to express need for assistance with ADLs?: Yes Does the patient have difficulty dressing or bathing?: No Independently performs ADLs?: Yes (appropriate for developmental age) Does the patient have difficulty walking or climbing stairs?: No Weakness of Legs: None Weakness of Arms/Hands:  None  Home Assistive Devices/Equipment Home Assistive Devices/Equipment: None  Therapy Consults (therapy consults require a physician order) PT  Evaluation Needed: No OT Evalulation Needed: No SLP Evaluation Needed: No Abuse/Neglect Assessment (Assessment to be complete while patient is alone) Abuse/Neglect Assessment Can Be Completed: Yes Physical Abuse: Denies Verbal Abuse: Denies Sexual Abuse: Denies Exploitation of patient/patient's resources: Denies Self-Neglect: Denies Values / Beliefs Cultural Requests During Hospitalization: None Spiritual Requests During Hospitalization: None Consults Spiritual Care Consult Needed: No Social Work Consult Needed: No Merchant navy officer (For Healthcare) Does Patient Have a Medical Advance Directive?: No Would patient like information on creating a medical advance directive?: No - Patient declined          Disposition:  Shuvon Rankin, NP recommends discharge home & follow up with outpt tx Disposition Initial Assessment Completed for this Encounter: Yes Disposition of Patient: Discharge  On Site Evaluation by:   Reviewed with Physician:    Clearnce Sorrel 06/09/2018 6:47 PM

## 2018-06-12 ENCOUNTER — Emergency Department (HOSPITAL_COMMUNITY)
Admission: EM | Admit: 2018-06-12 | Discharge: 2018-06-13 | Disposition: A | Discharge status: Other Acute Inpatient Hospital | Payer: Medicaid Other | Attending: Emergency Medicine | Admitting: Emergency Medicine

## 2018-06-12 ENCOUNTER — Encounter (HOSPITAL_COMMUNITY): Payer: Self-pay | Admitting: Emergency Medicine

## 2018-06-12 ENCOUNTER — Other Ambulatory Visit: Payer: Self-pay

## 2018-06-12 DIAGNOSIS — X838XXA Intentional self-harm by other specified means, initial encounter: Secondary | ICD-10-CM | POA: Diagnosis not present

## 2018-06-12 DIAGNOSIS — E119 Type 2 diabetes mellitus without complications: Secondary | ICD-10-CM | POA: Diagnosis not present

## 2018-06-12 DIAGNOSIS — Y999 Unspecified external cause status: Secondary | ICD-10-CM | POA: Diagnosis not present

## 2018-06-12 DIAGNOSIS — R45851 Suicidal ideations: Secondary | ICD-10-CM | POA: Diagnosis not present

## 2018-06-12 DIAGNOSIS — F329 Major depressive disorder, single episode, unspecified: Secondary | ICD-10-CM | POA: Insufficient documentation

## 2018-06-12 DIAGNOSIS — Z79899 Other long term (current) drug therapy: Secondary | ICD-10-CM | POA: Insufficient documentation

## 2018-06-12 DIAGNOSIS — T1491XA Suicide attempt, initial encounter: Secondary | ICD-10-CM | POA: Insufficient documentation

## 2018-06-12 DIAGNOSIS — Z7289 Other problems related to lifestyle: Secondary | ICD-10-CM | POA: Insufficient documentation

## 2018-06-12 DIAGNOSIS — Y9389 Activity, other specified: Secondary | ICD-10-CM | POA: Diagnosis not present

## 2018-06-12 DIAGNOSIS — Z046 Encounter for general psychiatric examination, requested by authority: Secondary | ICD-10-CM | POA: Insufficient documentation

## 2018-06-12 DIAGNOSIS — Y929 Unspecified place or not applicable: Secondary | ICD-10-CM | POA: Insufficient documentation

## 2018-06-12 DIAGNOSIS — Z1159 Encounter for screening for other viral diseases: Secondary | ICD-10-CM | POA: Insufficient documentation

## 2018-06-12 LAB — COMPREHENSIVE METABOLIC PANEL
ALT: 50 U/L — ABNORMAL HIGH (ref 0–44)
AST: 42 U/L — ABNORMAL HIGH (ref 15–41)
Albumin: 4 g/dL (ref 3.5–5.0)
Alkaline Phosphatase: 120 U/L (ref 38–126)
Anion gap: 5 (ref 5–15)
BUN: 5 mg/dL — ABNORMAL LOW (ref 6–20)
CO2: 26 mmol/L (ref 22–32)
Calcium: 10 mg/dL (ref 8.9–10.3)
Chloride: 108 mmol/L (ref 98–111)
Creatinine, Ser: 0.95 mg/dL (ref 0.61–1.24)
GFR calc Af Amer: >60 mL/min (ref >60)
GFR calc non Af Amer: >60 mL/min (ref >60)
Glucose, Bld: 102 mg/dL — ABNORMAL HIGH (ref 70–99)
Potassium: 4.4 mmol/L (ref 3.5–5.1)
Sodium: 139 mmol/L (ref 135–145)
Total Bilirubin: 0.5 mg/dL (ref 0.3–1.2)
Total Protein: 7.1 g/dL (ref 6.5–8.1)

## 2018-06-12 LAB — RAPID URINE DRUG SCREEN, HOSP PERFORMED
Amphetamines: NOT DETECTED
Barbiturates: NOT DETECTED
Benzodiazepines: NOT DETECTED
Cocaine: NOT DETECTED
Opiates: NOT DETECTED
Tetrahydrocannabinol: NOT DETECTED

## 2018-06-12 LAB — ETHANOL: Alcohol, Ethyl (B): <10 mg/dL (ref <10)

## 2018-06-12 LAB — CBC
HCT: 43.3 % (ref 39.0–52.0)
Hemoglobin: 13.7 g/dL (ref 13.0–17.0)
MCH: 29 pg (ref 26.0–34.0)
MCHC: 31.6 g/dL (ref 30.0–36.0)
MCV: 91.7 fL (ref 80.0–100.0)
Platelets: 253 10*3/uL (ref 150–400)
RBC: 4.72 MIL/uL (ref 4.22–5.81)
RDW: 14.8 % (ref 11.5–15.5)
WBC: 6.6 10*3/uL (ref 4.0–10.5)
nRBC: 0 % (ref 0.0–0.2)

## 2018-06-12 LAB — ACETAMINOPHEN LEVEL: Acetaminophen (Tylenol), Serum: <10 ug/mL — ABNORMAL LOW (ref 10–30)

## 2018-06-12 LAB — SALICYLATE LEVEL: Salicylate Lvl: <7 mg/dL (ref 2.8–30.0)

## 2018-06-12 LAB — VALPROIC ACID LEVEL: Valproic Acid Lvl: 127 ug/mL — ABNORMAL HIGH (ref 50.0–100.0)

## 2018-06-12 MED ORDER — LORATADINE 10 MG PO TABS
10.0000 mg | ORAL_TABLET | Freq: Every evening | ORAL | Status: DC
  Administered 2018-06-12 – 2018-06-13 (×2): 10 mg via ORAL
  Filled 2018-06-12 (×2): qty 1

## 2018-06-12 MED ORDER — DIVALPROEX SODIUM 250 MG PO DR TAB
1000.0000 mg | DELAYED_RELEASE_TABLET | Freq: Two times a day (BID) | ORAL | Status: DC
  Administered 2018-06-12 – 2018-06-13 (×2): 1000 mg via ORAL
  Filled 2018-06-12 (×2): qty 4

## 2018-06-12 MED ORDER — GABAPENTIN 300 MG PO CAPS
600.0000 mg | ORAL_CAPSULE | Freq: Three times a day (TID) | ORAL | Status: DC
  Administered 2018-06-12 – 2018-06-13 (×3): 600 mg via ORAL
  Filled 2018-06-12 (×3): qty 2

## 2018-06-12 MED ORDER — ACETAMINOPHEN 325 MG PO TABS: 650.0000 mg | ORAL_TABLET | ORAL | Status: DC | PRN

## 2018-06-12 MED ORDER — OLANZAPINE 5 MG PO TABS
5.0000 mg | ORAL_TABLET | Freq: Every day | ORAL | Status: DC
  Administered 2018-06-13: 5 mg via ORAL
  Filled 2018-06-12: qty 1

## 2018-06-12 MED ORDER — PROPRANOLOL HCL 40 MG PO TABS: 40.0000 mg | ORAL_TABLET | Freq: Two times a day (BID) | ORAL | Status: DC

## 2018-06-12 MED ORDER — ESCITALOPRAM OXALATE 10 MG PO TABS
20.0000 mg | ORAL_TABLET | Freq: Every day | ORAL | Status: DC
  Administered 2018-06-12 – 2018-06-13 (×2): 20 mg via ORAL
  Filled 2018-06-12 (×2): qty 2

## 2018-06-12 MED ORDER — OLANZAPINE 5 MG PO TABS
20.0000 mg | ORAL_TABLET | Freq: Every day | ORAL | Status: DC
  Administered 2018-06-12: 20 mg via ORAL
  Filled 2018-06-12: qty 2
  Filled 2018-06-12: qty 4

## 2018-06-12 MED ORDER — ARIPIPRAZOLE ER 400 MG IM SRER: 400.0000 mg | INTRAMUSCULAR | Status: DC

## 2018-06-12 MED ORDER — PANTOPRAZOLE SODIUM 40 MG PO TBEC
40.0000 mg | DELAYED_RELEASE_TABLET | Freq: Every day | ORAL | Status: DC
  Administered 2018-06-12 – 2018-06-13 (×2): 40 mg via ORAL
  Filled 2018-06-12 (×2): qty 1

## 2018-06-12 MED ORDER — PROPRANOLOL HCL 40 MG PO TABS
40.0000 mg | ORAL_TABLET | Freq: Two times a day (BID) | ORAL | Status: DC
  Administered 2018-06-12 – 2018-06-13 (×2): 40 mg via ORAL
  Filled 2018-06-12 (×2): qty 1

## 2018-06-12 MED ORDER — FERROUS SULFATE 325 (65 FE) MG PO TABS
325.0000 mg | ORAL_TABLET | Freq: Every day | ORAL | Status: DC
  Administered 2018-06-13: 325 mg via ORAL
  Filled 2018-06-12: qty 1

## 2018-06-12 MED ORDER — ONDANSETRON HCL 4 MG PO TABS: 4.0000 mg | ORAL_TABLET | Freq: Three times a day (TID) | ORAL | Status: DC | PRN

## 2018-06-12 NOTE — Progress Notes (Addendum)
TTS spoke with pt's mom who reports the pt cannot be given Ativan due to negative side effects. FYI updated and EDP Law, Waylan Boga, PA-C has been informed.  Princess Bruins, MSW, LCSW Therapeutic Triage Specialist  [phone]

## 2018-06-12 NOTE — ED Notes (Signed)
Updated pt's mom.

## 2018-06-12 NOTE — BH Assessment (Signed)
[facility] Assessment Progress Note   TTS consulted with Donell Sievert, PA who recommends inpt tx. TTS to seek placement due to no appropriate beds at [facility] per [facility]. EDP Law, Waylan Boga, PA-C has been advised.  Princess Bruins, MSW, LCSW Therapeutic Triage Specialist  [phone]
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
## 2018-06-12 NOTE — Progress Notes (Signed)
TTS calling cart, no answer  Aquicha Duff, MSW, LCSW Therapeutic Triage Specialist  [phone]  

## 2018-06-12 NOTE — Progress Notes (Signed)
Pt meets inpatient criteria per Donell Sievert, PA. Referral information has been sent to the following hospitals for review:  [facility]  [facility]  [facility]  [facility]  [facility]  [facility]  [facility]  [facility]   Disposition will continue to assist with inpatient placement needs.   Wells Guiles, LCSW, LCAS Disposition CSW [facility]/TTS [phone]
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
## 2018-06-12 NOTE — ED Provider Notes (Signed)
[facility] EMERGENCY DEPARTMENT Provider Note   CSN: [id number] Arrival date & time: 06/12/18  1658    History   Chief Complaint Chief Complaint  Patient presents with  . Suicidal    HPI Logan Harper is a 23 y.o. male with history of anxiety, MDD, diabetes who presents with suicidal ideation following attempted overdose.  Patient reports he took 2 unknown pills that were prescribed to him to try and kill himself.  He also try to cut himself.  Patient reports he has a bad relationship with his mother and does not feel safe going home.  He continues to have suicidal ideations.  He denies any homicidal ideation or hallucinations.  Patient reports since he took these pills, he has had some lightheadedness when he stands up, but otherwise feels okay.  Patient was just seen at [facility] for inpatient treatment.  He states he was not feeling great then.     HPI  Past Medical History:  Diagnosis Date  . Anxiety   . Depression   . Diabetes mellitus without complication (HCC)   . Headache(784.0)   . Obesity   . Suicidal ideation     Patient Active Problem List   Diagnosis Date Noted  . MDD (major depressive disorder), recurrent episode, severe (HCC) 05/20/2018  . MDD (major depressive disorder), recurrent, severe, with psychosis (HCC) 05/12/2018  . Conduct disorder, childhood-onset type, severe   . Suicidal ideation   . GAD (generalized anxiety disorder) 09/08/2012  . Fast heart beat 04/27/2012  . MDD (major depressive disorder), recurrent episode, moderate (HCC) 11/21/2011  . Conduct disorder, childhood-onset type 11/21/2011  . ADHD (attention deficit hyperactivity disorder), combined type 11/21/2011    Past Surgical History:  Procedure Laterality Date  . NO PAST SURGERIES          Home Medications    Prior to Admission medications   Medication Sig Start Date End Date Taking? Authorizing Provider  ARIPiprazole ER (ABILIFY MAINTENA) 400 MG SRER  injection Inject 2 mLs (400 mg total) into the muscle every 28 (twenty-eight) days. Due 6/7 Show to doctor- no need to fill 06/11/18  Yes Malvin Johns, MD  ferrous sulfate 325 (65 FE) MG tablet 1 every other day Patient taking differently: Take 325 mg by mouth daily with breakfast.  05/18/18  Yes Malvin Johns, MD  gabapentin (NEURONTIN) 600 MG tablet Take 1 tablet (600 mg total) by mouth 3 (three) times daily. 05/18/18   Malvin Johns, MD  loratadine (CLARITIN) 10 MG tablet Take 1 tablet (10 mg total) by mouth every evening. 05/18/18   Malvin Johns, MD  Multiple Vitamin (MULTIVITAMIN WITH MINERALS) TABS tablet Take 1 tablet by mouth daily.    [provider]  omeprazole (PRILOSEC) 40 MG capsule Take 40 mg by mouth daily.    [provider]  propranolol (INDERAL) 20 MG tablet Take 2 tablets (40 mg total) by mouth 2 (two) times daily. 12/10/13   Court Joy, PA-C    Family History No family history on file.  Social History Social History   Tobacco Use  . Smoking status: Never Smoker  . Smokeless tobacco: Never Used  Substance Use Topics  . Alcohol use: No  . Drug use: No     Allergies   Ativan [lorazepam] and Latuda [lurasidone]   Review of Systems Review of Systems  Constitutional: Negative for chills and fever.  HENT: Negative for facial swelling and sore throat.   Respiratory: Negative for shortness of breath.  Cardiovascular: Negative for chest pain.  Gastrointestinal: Negative for abdominal pain, nausea and vomiting.  Genitourinary: Negative for dysuria.  Musculoskeletal: Negative for back pain.  Skin: Negative for rash and wound.  Neurological: Positive for light-headedness. Negative for headaches.  Psychiatric/Behavioral: Positive for suicidal ideas. Negative for hallucinations. The patient is not nervous/anxious.      Physical Exam Updated Vital Signs BP 114/64   Pulse 96   Temp 98.7 F (37.1 C) (Oral)   Resp 16   Ht 6\' 2"  (1.88 m)   Wt 99.8  kg   SpO2 99%   BMI 28.25 kg/m   Physical Exam Vitals signs and nursing note reviewed.  Constitutional:      General: He is not in acute distress.    Appearance: He is well-developed. He is not diaphoretic.  HENT:     Head: Normocephalic and atraumatic.     Mouth/Throat:     Pharynx: No oropharyngeal exudate.  Eyes:     General: No scleral icterus.       Right eye: No discharge.        Left eye: No discharge.     Conjunctiva/sclera: Conjunctivae normal.     Pupils: Pupils are equal, round, and reactive to light.  Neck:     Musculoskeletal: Normal range of motion and neck supple.     Thyroid: No thyromegaly.  Cardiovascular:     Rate and Rhythm: Normal rate and regular rhythm.     Heart sounds: Normal heart sounds. No murmur. No friction rub. No gallop.   Pulmonary:     Effort: Pulmonary effort is normal. No respiratory distress.     Breath sounds: Normal breath sounds. No stridor. No wheezing or rales.  Abdominal:     General: Bowel sounds are normal. There is no distension.     Palpations: Abdomen is soft.     Tenderness: There is no abdominal tenderness. There is no guarding or rebound.  Lymphadenopathy:     Cervical: No cervical adenopathy.  Skin:    General: Skin is warm and dry.     Coloration: Skin is not pale.     Findings: No rash.  Neurological:     Mental Status: He is alert.     Coordination: Coordination normal.  Psychiatric:        Attention and Perception: He does not perceive auditory or visual hallucinations.        Thought Content: Thought content includes suicidal ideation. Thought content does not include homicidal ideation. Thought content includes suicidal plan. Thought content does not include homicidal plan.      ED Treatments / Results  Labs (all labs ordered are listed, but only abnormal results are displayed) Labs Reviewed  COMPREHENSIVE METABOLIC PANEL - Abnormal; Notable for the following components:      Result Value   Glucose, Bld  102 (*)    BUN 5 (*)    AST 42 (*)    ALT 50 (*)    All other components within normal limits  ACETAMINOPHEN LEVEL - Abnormal; Notable for the following components:   Acetaminophen (Tylenol), Serum <10 (*)    All other components within normal limits  ETHANOL  SALICYLATE LEVEL  CBC  RAPID URINE DRUG SCREEN, HOSP PERFORMED    EKG EKG Interpretation  Date/Time:  Friday June 12 2018 18:31:23 EDT Ventricular Rate:  79 PR Interval:    QRS Duration: 102 QT Interval:  364 QTC Calculation: 418 R Axis:   70 Text Interpretation:  Sinus  rhythm Abnormal Q suggests inferior infarct ST elev, probable normal early repol pattern No significant change since last tracing Confirmed by Frederick Peers [id number] on 06/12/2018 6:58:14 PM   Radiology No results found.  Procedures Procedures (including critical care time)  Medications Ordered in ED Medications  escitalopram (LEXAPRO) tablet 20 mg (has no administration in time range)  ferrous sulfate tablet 325 mg (has no administration in time range)  gabapentin (NEURONTIN) tablet 600 mg (has no administration in time range)  loratadine (CLARITIN) tablet 10 mg (has no administration in time range)  pantoprazole (PROTONIX) EC tablet 40 mg (has no administration in time range)  propranolol (INDERAL) tablet 40 mg (has no administration in time range)  acetaminophen (TYLENOL) tablet 650 mg (has no administration in time range)  ondansetron (ZOFRAN) tablet 4 mg (has no administration in time range)     Initial Impression / Assessment and Plan / ED Course  I have reviewed the triage vital signs and the nursing notes.  Pertinent labs & imaging results that were available during my care of the patient were reviewed by me and considered in my medical decision making (see chart for details).        Patient presenting with suicidal ideation.  Patient found to have mildly elevated LFTs, this is improved from 2 weeks ago.  Otherwise labs unremarkable.  EKG unchanged from last tracing. TTS recommends inpatient placement and will seek accepting facility.  Patient is here voluntarily at this time, but would IVC if necessary.  Defer to TTS for placement.  Final Clinical Impressions(s) / ED Diagnoses   Final diagnoses:  Suicidal ideation    ED Discharge Orders    None       Emi Holes, Cordelia Poche 06/12/18 2045    Little, Ambrose Finland, MD 06/19/18 [phone]

## 2018-06-13 LAB — SARS CORONAVIRUS 2 BY RT PCR (HOSPITAL ORDER, PERFORMED IN ~~LOC~~ HOSPITAL LAB): SARS Coronavirus 2: NEGATIVE

## 2018-06-13 NOTE — ED Notes (Signed)
Pt being monitored by staff - Staffing Office advised no Sitter available at this time. Charge RN aware.

## 2018-06-13 NOTE — Progress Notes (Addendum)
9:30AM: CSW was informed by RN mother called requesting patient to come home. CSW notes patient's mother previously was pushing for group home placement after a psychiatric hospitalization. CSW notes that arranging a group home for the patient would be much more feasible out of the ED. CSW is signing off at this time, please re-consult for future social work needs.  CSW acknowledges consult for group home placement after psych hospitalization. CSW will follow for discharge needs.  Lamonte Richer, LCSW, [place] Worker II [phone]

## 2018-06-13 NOTE — ED Notes (Signed)
Dinner tray ordered for pt

## 2018-06-13 NOTE — ED Notes (Signed)
Re-TTS being performed.  

## 2018-06-13 NOTE — ED Notes (Signed)
Pt's mother - Furqan Gosselin - [phone] - called requesting to speak w/pt. She appeared to be crying on the phone - stating "I just really want to talk to him. I miss him so much". Advised her pt is sleeping and will advise him she has called when he wakes.

## 2018-06-13 NOTE — ED Notes (Signed)
Pt on phone at nurses' desk talking w/his mother. 

## 2018-06-13 NOTE — ED Notes (Signed)
Pt ambulated to bathroom w/o difficulty. Pt on phone at nurses' desk. Pt noted to be wearing his eyeglasses.

## 2018-06-13 NOTE — Progress Notes (Signed)
CSW was informed that patient has been accepted to [facility] to their [place] unit. Accepting provider is Dr. Elaina Hoops, MD. Patient can arrive anytime this evening. Number to call report is [phone]. CSW notified nurse, Jacqlyn Larsen, RN regarding disposition.   Chalmers Guest. Guerry Bruin, MSW, [place] Work/Disposition Phone: [phone] Fax: [phone]

## 2018-06-13 NOTE — ED Notes (Signed)
Pt voiced understanding, appreciation, and agreement w/tx plan - seeking Inpt Placement. States he is experiencing SI thoughts but denies he would act upon them.

## 2018-06-13 NOTE — ED Notes (Signed)
Lunch tray ordered for pt.

## 2018-06-13 NOTE — ED Notes (Signed)
Breakfast tray ordered for pt

## 2018-06-13 NOTE — Progress Notes (Signed)
CSW contacted referral facilities with the following results:  Still reviewing: [facility] (resent per request) [facility] (resent per request) [place]: [facility] (at capacity) [place] (no beds) [facility] (due to acuity) [facility] (at capacity for the weekend)  TTS will continue to seek placement.  Chalmers Guest. Guerry Bruin, MSW, [place] Work/Disposition Phone: [phone] Fax: [phone]

## 2018-06-13 NOTE — BH Assessment (Signed)
TTS reassessed the pt.  He stated he took 2 pills yesterday in an attempt to kill himself. T he pt stated he still has plans to kill himself.  He denies having a plan currently.  He stated he is stressed about not getting along with his mother.  He denies HI, psychosis and SA.   The pt continues to meet inpatient criteria.

## 2018-06-13 NOTE — ED Notes (Signed)
Pt received breakfast tray 

## 2018-06-13 NOTE — ED Notes (Signed)
Per Secretary, pt's mother called to speak w/pt. Advised will request for pt to call her when he wakes.

## 2018-06-13 NOTE — ED Notes (Signed)
Pt returns to room, sitter at bedside.

## 2018-06-13 NOTE — ED Notes (Addendum)
ALL belongings - 1 labeled belongings bag - [place]. Pt aware.  

## 2018-06-13 NOTE — ED Notes (Signed)
Attempted to call report to [place] x 2 - no answer. Will continue to try later.

## 2018-06-13 NOTE — ED Provider Notes (Signed)
  Physical Exam  BP 115/75 (BP Location: Right Arm)   Pulse 94   Temp 98.4 F (36.9 C) (Oral)   Resp 20   Ht 6\' 2"  (1.88 m)   Wt 99.8 kg   SpO2 100%   BMI 28.25 kg/m   Physical Exam  ED Course/Procedures     Procedures  MDM  Accepted at [facility] [place].  Dr. Derry Lory, Ovid Curd, MD 06/13/18 [phone]

## 2018-06-13 NOTE — ED Notes (Signed)
TTS consult in progress. °

## 2018-06-13 NOTE — ED Notes (Signed)
Pt up at nurses station using phone to call mother.

## 2018-06-13 NOTE — ED Notes (Signed)
Pt awake watching tv

## 2018-06-13 NOTE — ED Notes (Signed)
Pt sleeping sitter at bedside. 

## 2018-06-13 NOTE — ED Notes (Signed)
Pt received lunch tray 

## 2018-06-22 LAB — HIV ANTIBODY (ROUTINE TESTING W REFLEX): HIV Screen 4th Generation wRfx: NONREACTIVE

## 2018-08-02 ENCOUNTER — Encounter (HOSPITAL_COMMUNITY): Payer: Self-pay

## 2018-08-02 ENCOUNTER — Other Ambulatory Visit: Payer: Self-pay

## 2018-08-02 ENCOUNTER — Emergency Department (HOSPITAL_COMMUNITY)
Admission: EM | Admit: 2018-08-02 | Discharge: 2018-08-02 | Disposition: A | Discharge status: Home / Self Care | Payer: Medicaid Other | Attending: Emergency Medicine | Admitting: Emergency Medicine

## 2018-08-02 DIAGNOSIS — Z79899 Other long term (current) drug therapy: Secondary | ICD-10-CM | POA: Diagnosis not present

## 2018-08-02 DIAGNOSIS — Z888 Allergy status to other drugs, medicaments and biological substances status: Secondary | ICD-10-CM | POA: Diagnosis not present

## 2018-08-02 DIAGNOSIS — R45851 Suicidal ideations: Secondary | ICD-10-CM | POA: Diagnosis not present

## 2018-08-02 DIAGNOSIS — F329 Major depressive disorder, single episode, unspecified: Secondary | ICD-10-CM | POA: Insufficient documentation

## 2018-08-02 DIAGNOSIS — F32A Depression, unspecified: Secondary | ICD-10-CM

## 2018-08-02 DIAGNOSIS — E119 Type 2 diabetes mellitus without complications: Secondary | ICD-10-CM | POA: Insufficient documentation

## 2018-08-02 LAB — RAPID URINE DRUG SCREEN, HOSP PERFORMED
Amphetamines: NOT DETECTED
Barbiturates: NOT DETECTED
Benzodiazepines: NOT DETECTED
Cocaine: NOT DETECTED
Opiates: NOT DETECTED
Tetrahydrocannabinol: NOT DETECTED

## 2018-08-02 NOTE — ED Notes (Signed)
Unable to keep patient in his room and refuses to change into scrubs and will not follow commands.

## 2018-08-02 NOTE — ED Notes (Signed)
Refuses to change into scrubs notified ER doctors of patients behavior and see if they would come see him to determine what needs to be done.

## 2018-08-02 NOTE — Discharge Instructions (Addendum)
Make sure you take all of your medications regularly.

## 2018-08-02 NOTE — ED Notes (Signed)
I made an unsuccessful attempt at drawing blood. He said he was feeling bad because he took some pills, and we would try to help him feel better by drawing blood to determine what's wrong. At first, he allowed it to happen, but then he decided against it.

## 2018-08-02 NOTE — ED Triage Notes (Signed)
Pt stays at [facility] and having hallucinations and taking pills to kill himself, taking clonazepam took at total of 100 mg around 1830 and is supposed to only take 50 mg, has violent outburst at times.

## 2018-08-06 NOTE — ED Provider Notes (Signed)
[facility]-EMERGENCY DEPT Provider Note   CSN: [id number] Arrival date & time: 08/02/18  1930     History   Chief Complaint Chief Complaint  Patient presents with  . Suicidal    HPI Logan Harper is a 23 y.o. male.     HPI   23 year old male sent to the emergency room as his mother is concerned about his behavior.  Patient stated that he wanted to harm himself.  He states that he took 1 additional clonazepam.  Denies any other ingestion.  Cannot tell me at specifically why he did this.  Denies any acute stressor.  Denies any other inappropriate ingestion.  Denies any hallucinations or thoughts of harming anybody else.  Past Medical History:  Diagnosis Date  . Anxiety   . Depression   . Diabetes mellitus without complication (HCC)   . Headache(784.0)   . Obesity   . Suicidal ideation     Patient Active Problem List   Diagnosis Date Noted  . MDD (major depressive disorder), recurrent episode, severe (HCC) 05/20/2018  . MDD (major depressive disorder), recurrent, severe, with psychosis (HCC) 05/12/2018  . Conduct disorder, childhood-onset type, severe   . Suicidal ideation   . GAD (generalized anxiety disorder) 09/08/2012  . Fast heart beat 04/27/2012  . MDD (major depressive disorder), recurrent episode, moderate (HCC) 11/21/2011  . Conduct disorder, childhood-onset type 11/21/2011  . ADHD (attention deficit hyperactivity disorder), combined type 11/21/2011    Past Surgical History:  Procedure Laterality Date  . NO PAST SURGERIES          Home Medications    Prior to Admission medications   Medication Sig Start Date End Date Taking? Authorizing Provider  ARIPiprazole ER (ABILIFY MAINTENA) 400 MG SRER injection Inject 2 mLs (400 mg total) into the muscle every 28 (twenty-eight) days. Due 6/7 Show to doctor- no need to fill 06/11/18   Malvin JohnsFarah, Brian, MD  divalproex (DEPAKOTE) 500 MG DR tablet Take 1,000 mg by mouth 2 (two) times daily.  06/02/18   [provider]  ferrous sulfate 325 (65 FE) MG tablet 1 every other day Patient taking differently: Take 325 mg by mouth daily with breakfast.  05/18/18   Malvin JohnsFarah, Brian, MD  gabapentin (NEURONTIN) 600 MG tablet Take 1 tablet (600 mg total) by mouth 3 (three) times daily. 05/18/18   Malvin JohnsFarah, Brian, MD  ibuprofen (ADVIL) 200 MG tablet Take 200-400 mg by mouth every 6 (six) hours as needed for headache.    [provider]  loratadine (CLARITIN) 10 MG tablet Take 1 tablet (10 mg total) by mouth every evening. 05/18/18   Malvin JohnsFarah, Brian, MD  Multiple Vitamin (MULTIVITAMIN WITH MINERALS) TABS tablet Take 1 tablet by mouth daily.    [provider]  OLANZapine (ZYPREXA) 20 MG tablet Take 20 mg by mouth at bedtime. 06/02/18   [provider]  OLANZapine (ZYPREXA) 5 MG tablet Take 5 mg by mouth every morning. 06/02/18   [provider]  omeprazole (PRILOSEC) 40 MG capsule Take 40 mg by mouth daily before breakfast.     [provider]  propranolol (INDERAL) 40 MG tablet Take 40 mg by mouth 2 (two) times daily.    [provider]    Family History History reviewed. No pertinent family history.  Social History Social History   Tobacco Use  . Smoking status: Never Smoker  . Smokeless tobacco: Never Used  Substance Use Topics  . Alcohol use: No  . Drug use: No  Allergies   Ativan [lorazepam] and Latuda [lurasidone]   Review of Systems Review of Systems  All systems reviewed and negative, other than as noted in HPI.  Physical Exam Updated Vital Signs BP (!) 144/73 (BP Location: Right Arm) Comment: Simultaneous filing. User may not have seen previous data.  Pulse 100 Comment: Simultaneous filing. User may not have seen previous data.  Temp 98.5 F (36.9 C) (Oral)   Resp 18 Comment: Simultaneous filing. User may not have seen previous data.  Ht 6' (1.829 m)   Wt (!) 144.5 kg   SpO2 99% Comment: Simultaneous filing. User  may not have seen previous data.  BMI 43.20 kg/m   Physical Exam Vitals signs and nursing note reviewed.  Constitutional:      General: He is not in acute distress.    Appearance: He is well-developed. He is obese.  HENT:     Head: Normocephalic and atraumatic.  Eyes:     General:        Right eye: No discharge.        Left eye: No discharge.     Conjunctiva/sclera: Conjunctivae normal.  Neck:     Musculoskeletal: Neck supple.  Cardiovascular:     Rate and Rhythm: Normal rate and regular rhythm.     Heart sounds: Normal heart sounds. No murmur. No friction rub. No gallop.   Pulmonary:     Effort: Pulmonary effort is normal. No respiratory distress.     Breath sounds: Normal breath sounds.  Abdominal:     General: There is no distension.     Palpations: Abdomen is soft.     Tenderness: There is no abdominal tenderness.  Musculoskeletal:        General: No tenderness.  Skin:    General: Skin is warm and dry.  Neurological:     Mental Status: He is alert.  Psychiatric:     Comments: Speech clear.  Content appropriate.  Does not appear to be responding to internal stimuli.  His attention span is fair.  He seems to have pretty poor insight.  Thought processes fairly logical.      ED Treatments / Results  Labs (all labs ordered are listed, but only abnormal results are displayed) Labs Reviewed  RAPID URINE DRUG SCREEN, HOSP PERFORMED    EKG None  Radiology No results found.  Procedures Procedures (including critical care time)  Medications Ordered in ED Medications - No data to display   Initial Impression / Assessment and Plan / ED Course  I have reviewed the triage vital signs and the nursing notes.  Pertinent labs & imaging results that were available during my care of the patient were reviewed by me and considered in my medical decision making (see chart for details).       23 year old male with depression.  Only took 1 additional clonazepam.  He has  no somatic complaints.  Longstanding history of depression.  He denies any acute stressor.  He is now telling me that he does not want to hurt himself.  He cannot tell me why to get additional pill earlier.  He tells me that he always feels depressed.  He states that he is currently unemployed to try to get a job.  He previously worked doing Nurse, adult work in a school.  He expresses some interest in doing this again.  Encouraged him to pursue this.  Overall he was very cooperative with me.  He does not appear to have a listed history of  intellectual disability but he strikes me as being on the lower end of the spectrum.  He certainly at least does not have very good insight.  He is generally calm and agreeable.  He took a minimal extra dose of medicine.  I do not anticipate any untoward effects because of this.  He is telling now that he does not want to harm himself.  He lives with his mother.  She is willing to pick him up.  He states that he has outpatient care.  At this time I think he is appropriate for discharge.  My overall impression is that his time is that his risk of actually committing serious harm to himself or anyone else is very low.  Final Clinical Impressions(s) / ED Diagnoses   Final diagnoses:  Depression, unspecified depression type    ED Discharge Orders    None       Raeford RazorKohut, Stephen, MD 08/06/18 [phone]

## 2018-08-19 ENCOUNTER — Emergency Department (HOSPITAL_COMMUNITY)
Admission: EM | Admit: 2018-08-19 | Discharge: 2018-08-20 | Disposition: A | Discharge status: Other Acute Inpatient Hospital | Payer: Medicaid Other | Attending: Emergency Medicine | Admitting: Emergency Medicine

## 2018-08-19 ENCOUNTER — Encounter (HOSPITAL_COMMUNITY): Payer: Self-pay

## 2018-08-19 ENCOUNTER — Other Ambulatory Visit: Payer: Self-pay

## 2018-08-19 DIAGNOSIS — Z79899 Other long term (current) drug therapy: Secondary | ICD-10-CM | POA: Diagnosis not present

## 2018-08-19 DIAGNOSIS — Z046 Encounter for general psychiatric examination, requested by authority: Secondary | ICD-10-CM | POA: Insufficient documentation

## 2018-08-19 DIAGNOSIS — F332 Major depressive disorder, recurrent severe without psychotic features: Secondary | ICD-10-CM | POA: Diagnosis not present

## 2018-08-19 DIAGNOSIS — E119 Type 2 diabetes mellitus without complications: Secondary | ICD-10-CM | POA: Insufficient documentation

## 2018-08-19 DIAGNOSIS — R45851 Suicidal ideations: Secondary | ICD-10-CM | POA: Diagnosis not present

## 2018-08-19 DIAGNOSIS — M79602 Pain in left arm: Secondary | ICD-10-CM | POA: Insufficient documentation

## 2018-08-19 DIAGNOSIS — F329 Major depressive disorder, single episode, unspecified: Secondary | ICD-10-CM | POA: Diagnosis present

## 2018-08-19 DIAGNOSIS — Z20828 Contact with and (suspected) exposure to other viral communicable diseases: Secondary | ICD-10-CM | POA: Diagnosis not present

## 2018-08-19 LAB — COMPREHENSIVE METABOLIC PANEL
ALT: 62 U/L — ABNORMAL HIGH (ref 0–44)
AST: 39 U/L (ref 15–41)
Albumin: 4.2 g/dL (ref 3.5–5.0)
Alkaline Phosphatase: 85 U/L (ref 38–126)
Anion gap: 11 (ref 5–15)
BUN: 8 mg/dL (ref 6–20)
CO2: 23 mmol/L (ref 22–32)
Calcium: 10 mg/dL (ref 8.9–10.3)
Chloride: 103 mmol/L (ref 98–111)
Creatinine, Ser: 0.93 mg/dL (ref 0.61–1.24)
GFR calc Af Amer: >60 mL/min (ref >60)
GFR calc non Af Amer: >60 mL/min (ref >60)
Glucose, Bld: 103 mg/dL — ABNORMAL HIGH (ref 70–99)
Potassium: 4.5 mmol/L (ref 3.5–5.1)
Sodium: 137 mmol/L (ref 135–145)
Total Bilirubin: 0.3 mg/dL (ref 0.3–1.2)
Total Protein: 7.8 g/dL (ref 6.5–8.1)

## 2018-08-19 LAB — ACETAMINOPHEN LEVEL: Acetaminophen (Tylenol), Serum: <10 ug/mL — ABNORMAL LOW (ref 10–30)

## 2018-08-19 LAB — CBC
HCT: 45.9 % (ref 39.0–52.0)
Hemoglobin: 15 g/dL (ref 13.0–17.0)
MCH: 29.2 pg (ref 26.0–34.0)
MCHC: 32.7 g/dL (ref 30.0–36.0)
MCV: 89.3 fL (ref 80.0–100.0)
Platelets: 280 10*3/uL (ref 150–400)
RBC: 5.14 MIL/uL (ref 4.22–5.81)
RDW: 14.2 % (ref 11.5–15.5)
WBC: 9 10*3/uL (ref 4.0–10.5)
nRBC: 0 % (ref 0.0–0.2)

## 2018-08-19 LAB — RAPID URINE DRUG SCREEN, HOSP PERFORMED
Amphetamines: NOT DETECTED
Barbiturates: NOT DETECTED
Benzodiazepines: NOT DETECTED
Cocaine: NOT DETECTED
Opiates: NOT DETECTED
Tetrahydrocannabinol: NOT DETECTED

## 2018-08-19 LAB — SALICYLATE LEVEL: Salicylate Lvl: <7 mg/dL (ref 2.8–30.0)

## 2018-08-19 LAB — ETHANOL: Alcohol, Ethyl (B): <10 mg/dL (ref <10)

## 2018-08-19 NOTE — ED Provider Notes (Addendum)
[facility]   CSN: [id number] Arrival date & time: 08/19/18  1733    History   Chief Complaint Chief Complaint  Patient presents with  . IVC    HPI Logan Harper is a 23 y.o. male with a history of diabetes mellitus, severe conduct disorder, GAD, ADHD, recurrent major depressive disorder who presents to the emergency department accompanied by [employer] with a chief complaint of suicidal ideation.  The patient was IVCed by [employer].  He reports that he has been having worsening depressed mood and suicidal thoughts over the last few weeks despite compliance with his home medications.  He reports that he had a plan earlier today of overdosing on pills.  He reports 2 previous suicide attempt by intentional overdose in the past.  He reports that he is also been thinking of cutting his wrists. No history of cutting and no current injuries. He also reports that he has had multiple behavioral health admissions previously.  He denies HI or auditory visual hallucinations.  He also reports that he became upset earlier earlier today and "destroyed his bedroom" at home.   He has no other complaints at this time including fever, chills, cough, shortness of breath, abdominal pain, or N/V/D.   He is a non-smoker.  He denies alcohol use.  No other IV or recreational drug use.     The history is provided by the patient and the police. No language interpreter was used.    Past Medical History:  Diagnosis Date  . Anxiety   . Depression   . Diabetes mellitus without complication (HCC)   . Headache(784.0)   . Obesity   . Suicidal ideation     Patient Active Problem List   Diagnosis Date Noted  . MDD (major depressive disorder), recurrent episode, severe (HCC) 05/20/2018  . MDD (major depressive disorder), recurrent, severe, with psychosis (HCC) 05/12/2018  . Conduct disorder, childhood-onset type, severe   . Suicidal ideation   . GAD (generalized  anxiety disorder) 09/08/2012  . Fast heart beat 04/27/2012  . MDD (major depressive disorder), recurrent episode, moderate (HCC) 11/21/2011  . Conduct disorder, childhood-onset type 11/21/2011  . ADHD (attention deficit hyperactivity disorder), combined type 11/21/2011    Past Surgical History:  Procedure Laterality Date  . NO PAST SURGERIES          Home Medications    Prior to Admission medications   Medication Sig Start Date End Date Taking? Authorizing Provider  ARIPiprazole ER (ABILIFY MAINTENA) 400 MG SRER injection Inject 2 mLs (400 mg total) into the muscle every 28 (twenty-eight) days. Due 6/7 Show to doctor- no need to fill 06/11/18   Malvin JohnsFarah, Brian, MD  divalproex (DEPAKOTE) 500 MG DR tablet Take 1,000 mg by mouth 2 (two) times daily. 06/02/18   [provider]  ferrous sulfate 325 (65 FE) MG tablet 1 every other day Patient taking differently: Take 325 mg by mouth daily with breakfast.  05/18/18   Malvin JohnsFarah, Brian, MD  gabapentin (NEURONTIN) 600 MG tablet Take 1 tablet (600 mg total) by mouth 3 (three) times daily. 05/18/18   Malvin JohnsFarah, Brian, MD  ibuprofen (ADVIL) 200 MG tablet Take 200-400 mg by mouth every 6 (six) hours as needed for headache.    [provider]  loratadine (CLARITIN) 10 MG tablet Take 1 tablet (10 mg total) by mouth every evening. 05/18/18   Malvin JohnsFarah, Brian, MD  Multiple Vitamin (MULTIVITAMIN WITH MINERALS) TABS tablet Take 1 tablet by mouth daily.  [provider]  OLANZapine (ZYPREXA) 20 MG tablet Take 20 mg by mouth at bedtime. 06/02/18   [provider]  OLANZapine (ZYPREXA) 5 MG tablet Take 5 mg by mouth every morning. 06/02/18   [provider]  omeprazole (PRILOSEC) 40 MG capsule Take 40 mg by mouth daily before breakfast.     [provider]  propranolol (INDERAL) 40 MG tablet Take 40 mg by mouth 2 (two) times daily.    [provider]    Family History History reviewed. No pertinent family  history.  Social History Social History   Tobacco Use  . Smoking status: Never Smoker  . Smokeless tobacco: Never Used  Substance Use Topics  . Alcohol use: No  . Drug use: No     Allergies   Ativan [lorazepam] and Latuda [lurasidone]   Review of Systems Review of Systems  Constitutional: Negative for appetite change and fever.  HENT: Negative for congestion and sore throat.   Eyes: Negative for visual disturbance.  Respiratory: Negative for shortness of breath.   Cardiovascular: Negative for chest pain.  Gastrointestinal: Negative for abdominal pain, diarrhea, nausea and vomiting.  Genitourinary: Negative for dysuria, frequency, hematuria and urgency.  Musculoskeletal: Negative for back pain.  Skin: Negative for rash.  Allergic/Immunologic: Negative for immunocompromised state.  Neurological: Negative for weakness, numbness and headaches.  Psychiatric/Behavioral: Positive for dysphoric mood and suicidal ideas. Negative for confusion and hallucinations.   Physical Exam Updated Vital Signs BP (!) 143/88 (BP Location: Right Arm)   Pulse 89   Temp 98.6 F (37 C) (Oral)   Resp 18   Ht 6\' 2"  (1.88 m)   Wt 133.8 kg   SpO2 97%   BMI 37.88 kg/m   Physical Exam Vitals signs and nursing note reviewed.  Constitutional:      General: He is not in acute distress.    Appearance: He is well-developed. He is obese. He is not ill-appearing, toxic-appearing or diaphoretic.  HENT:     Head: Normocephalic.  Eyes:     Conjunctiva/sclera: Conjunctivae normal.  Neck:     Musculoskeletal: Neck supple.  Cardiovascular:     Rate and Rhythm: Normal rate and regular rhythm.     Pulses: Normal pulses.     Heart sounds: Normal heart sounds. No murmur. No friction rub.  Pulmonary:     Effort: Pulmonary effort is normal. No respiratory distress.     Breath sounds: No stridor. No wheezing, rhonchi or rales.  Chest:     Chest wall: No tenderness.  Abdominal:     General: There is no  distension.     Palpations: Abdomen is soft.  Skin:    General: Skin is warm and dry.  Neurological:     Mental Status: He is alert.  Psychiatric:        Mood and Affect: Affect is flat.        Behavior: Behavior normal. Behavior is cooperative.        Thought Content: Thought content is not paranoid or delusional. Thought content includes suicidal ideation. Thought content does not include homicidal ideation. Thought content includes suicidal plan. Thought content does not include homicidal plan.      ED Treatments / Results  Labs (all labs ordered are listed, but only abnormal results are displayed) Labs Reviewed  COMPREHENSIVE METABOLIC PANEL - Abnormal; Notable for the following components:      Result Value   Glucose, Bld 103 (*)    ALT 62 (*)  All other components within normal limits  ACETAMINOPHEN LEVEL - Abnormal; Notable for the following components:   Acetaminophen (Tylenol), Serum <10 (*)    All other components within normal limits  ETHANOL  SALICYLATE LEVEL  CBC  RAPID URINE DRUG SCREEN, HOSP PERFORMED    EKG None  Radiology No results found.  Procedures Procedures (including critical care time)  Medications Ordered in ED Medications - No data to display   Initial Impression / Assessment and Plan / ED Course  I have reviewed the triage vital signs and the nursing notes.  Pertinent labs & imaging results that were available during my care of the patient were reviewed by me and considered in my medical decision making (see chart for details).        23 year old male with a history of diabetes mellitus, severe conduct disorder, GAD, ADHD, recurrent major depressive disorder accompanied by [employer] presenting with suicidal ideation with a plan to overdose on his home medications.  Patient was brought in under IVC by [employer].  First examination form was completed by Dr. Bebe ShaggyWickline.  Physical exam is reassuring.  ALT is slightly elevated at 62, but this appears  chronic.  UDS is unremarkable.  Patient is medically cleared at this time.  Patient was seen by TTS and inpatient admission was recommended. Psych hold orders placed. Home medications are pending pharmacy reconciliation at this time.  Spoke with pharmacy who reports that the only way to verify the medications are with the patient's mother, and they will attempt to contact her and reconcile medications.  Will hold on placing her medications at this time as it appears that the patient has had several medications that he has started and then stopped and then restarted again.  Please see psych team notes for further documentation of care/dispo. Pt stable at time of med clearance.     Final Clinical Impressions(s) / ED Diagnoses   Final diagnoses:  Severe episode of recurrent major depressive disorder, without psychotic features [facility](HCC)    ED Discharge Orders    None       ,  A, PA-C 08/20/18 0017    ,  A, PA-C 08/20/18 0049    Zadie RhineWickline, Donald, MD 08/20/18 [phone]

## 2018-08-19 NOTE — ED Triage Notes (Signed)
Pt BIB [employer] for eval of suicidal thoughts x 3 weeks. Pt reports that he told his mother he was having SI, pt denies having a plan or means to commit suicide. Pt is calm/cooperative/pleasant in triage. [employer] at bedside.

## 2018-08-19 NOTE — Progress Notes (Signed)
Pt accepted to [facility] [place] [facility]  Dr. Jonelle Sports is the accepting/attending provider.    Call report to [phone]  RN@ [facility] ED notified.     Pt is involuntary and will be transported by law enforcement.    Pt is scheduled to arrive on 08/20/18 after [place], [place], [place] Disposition [facility]W [facility]/TTS [phone]
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
## 2018-08-19 NOTE — Progress Notes (Signed)
Pt meets inpatient criteria per Earleen Newport, NP. Referral information has been sent to the following hospitals for review:  [facility]      Disposition will continue to follow for inpatient placement needs.   Audree Camel, LCSW, [place] Disposition [facility]/TTS [phone]

## 2018-08-20 LAB — CBG MONITORING, ED: Glucose-Capillary: 85 mg/dL (ref 70–99)

## 2018-08-20 LAB — SARS CORONAVIRUS 2 BY RT PCR (HOSPITAL ORDER, PERFORMED IN ~~LOC~~ HOSPITAL LAB): SARS Coronavirus 2: NEGATIVE

## 2018-08-20 MED ORDER — ACETAMINOPHEN 325 MG PO TABS: 650.0000 mg | ORAL_TABLET | ORAL | Status: DC | PRN

## 2018-08-20 MED ORDER — DIVALPROEX SODIUM 250 MG PO DR TAB
500.0000 mg | DELAYED_RELEASE_TABLET | Freq: Two times a day (BID) | ORAL | Status: DC
  Administered 2018-08-20: 500 mg via ORAL
  Filled 2018-08-20: qty 2

## 2018-08-20 MED ORDER — PANTOPRAZOLE SODIUM 40 MG PO TBEC
40.0000 mg | DELAYED_RELEASE_TABLET | Freq: Every day | ORAL | Status: DC
  Administered 2018-08-20: 40 mg via ORAL
  Filled 2018-08-20: qty 1

## 2018-08-20 MED ORDER — HALOPERIDOL LACTATE 5 MG/ML IJ SOLN
5.0000 mg | Freq: Once | INTRAMUSCULAR | Status: AC
  Administered 2018-08-20: 5 mg via INTRAMUSCULAR
  Filled 2018-08-20: qty 1

## 2018-08-20 MED ORDER — PROPRANOLOL HCL 40 MG PO TABS
40.0000 mg | ORAL_TABLET | Freq: Two times a day (BID) | ORAL | Status: DC
  Administered 2018-08-20: 40 mg via ORAL
  Filled 2018-08-20 (×2): qty 1

## 2018-08-20 MED ORDER — ALUM & MAG HYDROXIDE-SIMETH 200-200-20 MG/5ML PO SUSP: 30.0000 mL | Freq: Four times a day (QID) | ORAL | Status: DC | PRN

## 2018-08-20 NOTE — ED Notes (Signed)
Lunch Tray Ordered @ 1126.  

## 2018-08-20 NOTE — ED Notes (Signed)
[phone] pts mother Logan Harper

## 2018-08-20 NOTE — ED Notes (Signed)
Pt asking for med for lt arm pain.  He is now staying on the stretcher

## 2018-08-20 NOTE — ED Notes (Signed)
Report called to [facility] all questions answered at this time.

## 2018-08-20 NOTE — ED Notes (Signed)
Pt laying in floor beside bed

## 2018-08-20 NOTE — ED Notes (Signed)
The pt mother called inquiring about the pt   Unable to give info about the pt that she ivcd

## 2018-08-20 NOTE — ED Notes (Signed)
Pt hiding in corner in room behind curtain. This tech asked the patient to please get back in the bed and pt stated "no I'm good right here" pt informed that he needs to get in the bed or he will be moved to the hallway. Pt then stated again "No i'm good right here" and proceeded to sit down on the floor. [employer] and security called to the room to talk to pt.

## 2018-08-20 NOTE — ED Notes (Signed)
Pt given haldol  He got up from the floor and sat on the stretcher until I gave him the in jection

## 2018-08-20 NOTE — ED Notes (Signed)
Pt lying in the floor beside his stretcher refusing to get in the bed.  gp[employer] and security attempted to talk to him to convince him to get into the bed

## 2018-08-20 NOTE — ED Notes (Signed)
Breakfast ordered 

## 2018-08-20 NOTE — ED Provider Notes (Signed)
I was asked to fill out the first exam for involuntary commitment on this patient in the emergency department.  I reviewed the prior emergency department record along with the IVC that was filled out by the sheriff's department.  I have ordered the patient's daily morning meds and it sounds like he is going to be transferred to an inpatient facility.   Hayden Rasmussen, MD 08/20/18 [phone]

## 2018-08-20 NOTE — ED Notes (Signed)
PT resting in bed at this time.

## 2018-08-20 NOTE — ED Notes (Signed)
Updated mother regarding patient POC and wait for [employer] to transfer.  Mother very concerned and sounds upset at the whole mental health system. Has been attempted to stabilize medications with the psychiatrist, but pt had episofe of SI and plan to take all his pills, thus sent to ED.  She complains that he will be sent and his meds will be interrupted again and he will still have no progress.  Attempts to console very limited d/t her pre-conceived ideas.  Dr. Melina Copa consulted and AM meds ordered per mother's request.  Pt remains calm and cooperative in room, he is updated on POC as well.  Pt speaking to mother at this time on the phone.

## 2018-08-20 NOTE — ED Notes (Addendum)
Pt back in floor refusing to get up and get in the bed. [employer] in the room talking to pt but he continues to say "I'm good right here"

## 2018-08-20 NOTE — ED Notes (Signed)
Pt currently back on bed with [employer] and security in the room

## 2019-04-06 ENCOUNTER — Encounter (HOSPITAL_COMMUNITY): Payer: Self-pay | Admitting: Emergency Medicine

## 2019-04-06 ENCOUNTER — Emergency Department (HOSPITAL_COMMUNITY)
Admission: EM | Admit: 2019-04-06 | Discharge: 2019-04-08 | Disposition: A | Discharge status: Other Acute Inpatient Hospital | Payer: Medicaid Other | Attending: Emergency Medicine | Admitting: Emergency Medicine

## 2019-04-06 ENCOUNTER — Other Ambulatory Visit: Payer: Self-pay

## 2019-04-06 DIAGNOSIS — Z79899 Other long term (current) drug therapy: Secondary | ICD-10-CM | POA: Diagnosis not present

## 2019-04-06 DIAGNOSIS — Z046 Encounter for general psychiatric examination, requested by authority: Secondary | ICD-10-CM | POA: Diagnosis present

## 2019-04-06 DIAGNOSIS — F329 Major depressive disorder, single episode, unspecified: Secondary | ICD-10-CM | POA: Diagnosis not present

## 2019-04-06 DIAGNOSIS — E119 Type 2 diabetes mellitus without complications: Secondary | ICD-10-CM | POA: Insufficient documentation

## 2019-04-06 DIAGNOSIS — R45851 Suicidal ideations: Secondary | ICD-10-CM

## 2019-04-06 DIAGNOSIS — Z20822 Contact with and (suspected) exposure to covid-19: Secondary | ICD-10-CM | POA: Insufficient documentation

## 2019-04-06 LAB — CBC
HCT: 44.5 % (ref 39.0–52.0)
Hemoglobin: 14.5 g/dL (ref 13.0–17.0)
MCH: 29 pg (ref 26.0–34.0)
MCHC: 32.6 g/dL (ref 30.0–36.0)
MCV: 89 fL (ref 80.0–100.0)
Platelets: 313 10*3/uL (ref 150–400)
RBC: 5 MIL/uL (ref 4.22–5.81)
RDW: 13.8 % (ref 11.5–15.5)
WBC: 9.9 10*3/uL (ref 4.0–10.5)
nRBC: 0 % (ref 0.0–0.2)

## 2019-04-06 LAB — COMPREHENSIVE METABOLIC PANEL
ALT: 76 U/L — ABNORMAL HIGH (ref 0–44)
AST: 57 U/L — ABNORMAL HIGH (ref 15–41)
Albumin: 4.1 g/dL (ref 3.5–5.0)
Alkaline Phosphatase: 82 U/L (ref 38–126)
Anion gap: 11 (ref 5–15)
BUN: 5 mg/dL — ABNORMAL LOW (ref 6–20)
CO2: 25 mmol/L (ref 22–32)
Calcium: 10.1 mg/dL (ref 8.9–10.3)
Chloride: 105 mmol/L (ref 98–111)
Creatinine, Ser: 1.01 mg/dL (ref 0.61–1.24)
GFR calc Af Amer: >60 mL/min (ref >60)
GFR calc non Af Amer: >60 mL/min (ref >60)
Glucose, Bld: 105 mg/dL — ABNORMAL HIGH (ref 70–99)
Potassium: 4.4 mmol/L (ref 3.5–5.1)
Sodium: 141 mmol/L (ref 135–145)
Total Bilirubin: 0.1 mg/dL — ABNORMAL LOW (ref 0.3–1.2)
Total Protein: 8.2 g/dL — ABNORMAL HIGH (ref 6.5–8.1)

## 2019-04-06 LAB — ACETAMINOPHEN LEVEL: Acetaminophen (Tylenol), Serum: <10 ug/mL — ABNORMAL LOW (ref 10–30)

## 2019-04-06 LAB — RAPID URINE DRUG SCREEN, HOSP PERFORMED
Amphetamines: NOT DETECTED
Barbiturates: NOT DETECTED
Benzodiazepines: NOT DETECTED
Cocaine: NOT DETECTED
Opiates: NOT DETECTED
Tetrahydrocannabinol: NOT DETECTED

## 2019-04-06 LAB — ETHANOL: Alcohol, Ethyl (B): <10 mg/dL (ref <10)

## 2019-04-06 LAB — SALICYLATE LEVEL: Salicylate Lvl: <7 mg/dL — ABNORMAL LOW (ref 7.0–30.0)

## 2019-04-06 MED ORDER — PANTOPRAZOLE SODIUM 40 MG PO TBEC
40.0000 mg | DELAYED_RELEASE_TABLET | Freq: Every day | ORAL | Status: DC
  Filled 2019-04-06: qty 1

## 2019-04-06 MED ORDER — LORATADINE 10 MG PO TABS: 10.0000 mg | ORAL_TABLET | Freq: Every evening | ORAL | Status: DC

## 2019-04-06 MED ORDER — DIVALPROEX SODIUM 250 MG PO DR TAB
500.0000 mg | DELAYED_RELEASE_TABLET | Freq: Two times a day (BID) | ORAL | Status: DC
  Administered 2019-04-06: 500 mg via ORAL
  Filled 2019-04-06 (×2): qty 2

## 2019-04-06 MED ORDER — MIDAZOLAM HCL 5 MG/5ML IJ SOLN: 5.0000 mg | Freq: Once | INTRAMUSCULAR | Status: AC

## 2019-04-06 MED ORDER — FERROUS SULFATE 325 (65 FE) MG PO TABS
325.0000 mg | ORAL_TABLET | Freq: Every day | ORAL | Status: DC
  Filled 2019-04-06: qty 1

## 2019-04-06 MED ORDER — HALOPERIDOL LACTATE 5 MG/ML IJ SOLN
5.0000 mg | Freq: Once | INTRAMUSCULAR | Status: AC
  Administered 2019-04-06: 5 mg via INTRAMUSCULAR
  Filled 2019-04-06: qty 1

## 2019-04-06 MED ORDER — PROPRANOLOL HCL 40 MG PO TABS
40.0000 mg | ORAL_TABLET | Freq: Two times a day (BID) | ORAL | Status: DC
  Administered 2019-04-06: 40 mg via ORAL
  Filled 2019-04-06 (×3): qty 1

## 2019-04-06 MED ORDER — MIDAZOLAM HCL 2 MG/2ML IJ SOLN
INTRAMUSCULAR | Status: AC
  Filled 2019-04-06: qty 4

## 2019-04-06 MED ORDER — ESCITALOPRAM OXALATE 10 MG PO TABS
20.0000 mg | ORAL_TABLET | Freq: Every day | ORAL | Status: DC
  Administered 2019-04-06: 20 mg via ORAL
  Filled 2019-04-06: qty 2

## 2019-04-06 MED ORDER — MIDAZOLAM HCL 2 MG/2ML IJ SOLN
INTRAMUSCULAR | Status: AC
  Filled 2019-04-06: qty 2

## 2019-04-06 MED ORDER — CLOZAPINE 100 MG PO TABS
100.0000 mg | ORAL_TABLET | Freq: Every day | ORAL | Status: DC
  Administered 2019-04-06: 100 mg via ORAL
  Filled 2019-04-06 (×2): qty 1

## 2019-04-06 NOTE — ED Notes (Signed)
Code star initiated on patient while waiting in triage by Security; This RN responded to the code call; Patient was highly agitated and demanding medications;It is unknown patient behavior prior to this RN arrival. Pt was upset about length of time it was taking to be seen by EDP and to get medications; Multiple [employer] and security as well as hospital staff present; Dr. Clarene Duke responded to call and evaluating patient; [employer] presented IVC papers while in triage. Patient states he has not been himself and that he attempted to commit suicide via cutting; patient ask EDP if he could leave; EDP explained to patient that he has to be fully evaluated for safety of release; Pt escorted to exam room with [employer] and security present; Patient will be dressed in purple scrubs on arrival to treatment room-Monique,RN

## 2019-04-06 NOTE — ED Triage Notes (Signed)
Pt reports suicidal thoughts and trying to cut himself today. Reports he is taking medications.

## 2019-04-06 NOTE — ED Notes (Signed)
5mg  Versed given IM. Meds pulled by , RN. 5mg  Haldol given, meds pulled by Woodroe Chen, RN

## 2019-04-06 NOTE — ED Provider Notes (Signed)
[facility] EMERGENCY DEPARTMENT Provider Note   CSN: [id number] Arrival date & time: 04/06/19  1741     History Chief Complaint  Patient presents with  . Suicidal    Dedric Ethington is a 24 y.o. male.  24yo M w/ PMH including MDD, anxiety, T2DM who p/w suicidal ideation.  Patient was brought in under IVC after he told behavioral health counselor that he was feeling suicidal with a plan to cut himself.  He was reportedly throwing things around in his apartment stating that he was trying to find a knife to use on himself.  He tells me over and over again, " I just want my medications. I'm feeling suicidal. I don't want to feel like this anymore. I just want my medications."  LEVEL 5 CAVEAT DUE TO PSYCHIATRIC ILLNESS  The history is provided by the patient and the police.       Past Medical History:  Diagnosis Date  . Anxiety   . Depression   . Diabetes mellitus without complication (HCC)   . Headache(784.0)   . Obesity   . Suicidal ideation     Patient Active Problem List   Diagnosis Date Noted  . MDD (major depressive disorder), recurrent episode, severe (HCC) 05/20/2018  . MDD (major depressive disorder), recurrent, severe, with psychosis (HCC) 05/12/2018  . Conduct disorder, childhood-onset type, severe   . Suicidal ideation   . GAD (generalized anxiety disorder) 09/08/2012  . Fast heart beat 04/27/2012  . MDD (major depressive disorder), recurrent episode, moderate (HCC) 11/21/2011  . Conduct disorder, childhood-onset type 11/21/2011  . ADHD (attention deficit hyperactivity disorder), combined type 11/21/2011    Past Surgical History:  Procedure Laterality Date  . NO PAST SURGERIES         No family history on file.  Social History   Tobacco Use  . Smoking status: Never Smoker  . Smokeless tobacco: Never Used  Substance Use Topics  . Alcohol use: No  . Drug use: No    Home Medications Prior to Admission medications   Medication  Sig Start Date End Date Taking? Authorizing Provider  ARIPiprazole ER (ABILIFY MAINTENA) 400 MG SRER injection Inject 2 mLs (400 mg total) into the muscle every 28 (twenty-eight) days. Due 6/7 Show to doctor- no need to fill 06/11/18   Malvin Johns, MD  cloZAPine (CLOZARIL) 100 MG tablet Take 100 mg by mouth at bedtime. 06/27/18   [provider]  divalproex (DEPAKOTE) 500 MG DR tablet Take 500 mg by mouth 2 (two) times daily.  06/02/18   [provider]  escitalopram (LEXAPRO) 20 MG tablet Take 20 mg by mouth at bedtime.    [provider]  ferrous sulfate 325 (65 FE) MG tablet 1 every other day Patient taking differently: Take 325 mg by mouth daily with breakfast.  05/18/18   Malvin Johns, MD  gabapentin (NEURONTIN) 600 MG tablet Take 1 tablet (600 mg total) by mouth 3 (three) times daily. Patient not taking: Reported on 08/20/2018 05/18/18   Malvin Johns, MD  ibuprofen (ADVIL) 200 MG tablet Take 200-400 mg by mouth every 6 (six) hours as needed for headache.    [provider]  loratadine (CLARITIN) 10 MG tablet Take 1 tablet (10 mg total) by mouth every evening. 05/18/18   Malvin Johns, MD  Multiple Vitamin (MULTIVITAMIN WITH MINERALS) TABS tablet Take 1 tablet by mouth daily.    [provider]  omeprazole (PRILOSEC) 40 MG capsule Take 40 mg by mouth  daily before breakfast.     [provider]  propranolol (INDERAL) 40 MG tablet Take 40 mg by mouth 2 (two) times daily.    [provider]    Allergies    Ativan [lorazepam] and Latuda [lurasidone]  Review of Systems   Review of Systems  Unable to perform ROS: Psychiatric disorder    Physical Exam Updated Vital Signs BP 135/89   Pulse 96   Temp 98 F (36.7 C) (Oral)   Resp 16   Ht 6\' 3"  (1.905 m)   SpO2 98%   BMI 36.87 kg/m   Physical Exam Vitals and nursing note reviewed.  Constitutional:      Appearance: He is well-developed. He is obese. He is diaphoretic.      Comments: Agitated, pacing in triage  HENT:     Head: Normocephalic and atraumatic.  Eyes:     Conjunctiva/sclera: Conjunctivae normal.  Musculoskeletal:        General: Normal range of motion.     Cervical back: Neck supple.  Skin:    General: Skin is warm.  Neurological:     Mental Status: He is alert and oriented to person, place, and time.     Comments: Fluent speech, normal gait  Psychiatric:        Attention and Perception: He is inattentive.        Mood and Affect: Mood is depressed.        Speech: Speech is tangential.        Behavior: Behavior is agitated.        Thought Content: Thought content includes suicidal ideation. Thought content includes suicidal plan.        Judgment: Judgment is impulsive.     ED Results / Procedures / Treatments   Labs (all labs ordered are listed, but only abnormal results are displayed) Labs Reviewed  COMPREHENSIVE METABOLIC PANEL - Abnormal; Notable for the following components:      Result Value   Glucose, Bld 105 (*)    BUN 5 (*)    Total Protein 8.2 (*)    AST 57 (*)    ALT 76 (*)    Total Bilirubin 0.1 (*)    All other components within normal limits  SALICYLATE LEVEL - Abnormal; Notable for the following components:   Salicylate Lvl <1.9 (*)    All other components within normal limits  ACETAMINOPHEN LEVEL - Abnormal; Notable for the following components:   Acetaminophen (Tylenol), Serum <10 (*)    All other components within normal limits  ETHANOL  CBC  RAPID URINE DRUG SCREEN, HOSP PERFORMED    EKG None  Radiology No results found.  Procedures Procedures (including critical care time) CRITICAL CARE Performed by: Wenda Overland Ming Mcmannis   Total critical care time: 30 minutes  Critical care time was exclusive of separately billable procedures and treating other patients.  Critical care was necessary to treat or prevent imminent or life-threatening deterioration.  Critical care was time spent personally by me  on the following activities: development of treatment plan with patient and/or surrogate as well as nursing, discussions with consultants, evaluation of patient's response to treatment, examination of patient, obtaining history from patient or surrogate, ordering and performing treatments and interventions, ordering and review of laboratory studies, ordering and review of radiographic studies, pulse oximetry and re-evaluation of patient's condition.  Medications Ordered in ED Medications  cloZAPine (CLOZARIL) tablet 100 mg (has no administration in time range)  divalproex (DEPAKOTE) DR tablet 500 mg (  has no administration in time range)  escitalopram (LEXAPRO) tablet 20 mg (has no administration in time range)  ferrous sulfate tablet 325 mg (has no administration in time range)  pantoprazole (PROTONIX) EC tablet 40 mg (has no administration in time range)  propranolol (INDERAL) tablet 40 mg (has no administration in time range)  loratadine (CLARITIN) tablet 10 mg (has no administration in time range)  midazolam (VERSED) 5 MG/5ML injection 5 mg ( Intramuscular Given 04/06/19 2253)  haloperidol lactate (HALDOL) injection 5 mg (5 mg Intramuscular Given 04/06/19 2245)  midazolam (VERSED) 2 MG/2ML injection (  Given 04/06/19 2253)    ED Course  I have reviewed the triage vital signs and the nursing notes.  Pertinent labs that were available during my care of the patient were reviewed by me and considered in my medical decision making (see chart for details).    MDM Rules/Calculators/A&P                      PT was pacing around triage and acutely agitated w/ multiple police officers present on my arrival. He repeatedly asked for medicines to stop his suicidal thoughts. Because of agitation, gave IM haldol followed by IM versed. Pt calmed down and was taken back to ED bed. Labs unremarkable, he is medically clear for psychiatric evaluation. Dispo pending TTS eval.  Final Clinical Impression(s) / ED  Diagnoses Final diagnoses:  None    Rx / DC Orders ED Discharge Orders    None       Noboru Bidinger, Ambrose Finland, MD 04/06/19 2322

## 2019-04-06 NOTE — ED Provider Notes (Signed)
11:10 PM Assumed care from Dr. Clarene Duke, please see their note for full history, physical and decision making until this point. In brief this is a 24 y.o. year old male who presented to the ED tonight with Suicidal     Mostly SI. Needs meds. Pending medication effect and TTS eval. First exam already done.   IVC saying plan for cutting himself.   Patient repeatedly trying to leave still with suicidal thoughts. Multiple medications given. Discussed with pharmacy, will try zyprexa/haldol combination. Will get ecg when more calm.  Labs, studies and imaging reviewed by myself and considered in medical decision making if ordered. Imaging interpreted by radiology.  Labs Reviewed  COMPREHENSIVE METABOLIC PANEL - Abnormal; Notable for the following components:      Result Value   Glucose, Bld 105 (*)    BUN 5 (*)    Total Protein 8.2 (*)    AST 57 (*)    ALT 76 (*)    Total Bilirubin 0.1 (*)    All other components within normal limits  SALICYLATE LEVEL - Abnormal; Notable for the following components:   Salicylate Lvl <7.0 (*)    All other components within normal limits  ACETAMINOPHEN LEVEL - Abnormal; Notable for the following components:   Acetaminophen (Tylenol), Serum <10 (*)    All other components within normal limits  ETHANOL  CBC  RAPID URINE DRUG SCREEN, HOSP PERFORMED    No orders to display    No follow-ups on file.    Marily Memos, MD 04/08/19 2300

## 2019-04-07 LAB — SARS CORONAVIRUS 2 (TAT 6-24 HRS): SARS Coronavirus 2: NEGATIVE

## 2019-04-07 MED ORDER — MIDAZOLAM HCL 2 MG/2ML IJ SOLN
INTRAMUSCULAR | Status: AC
  Filled 2019-04-07: qty 4

## 2019-04-07 MED ORDER — HALOPERIDOL LACTATE 5 MG/ML IJ SOLN
INTRAMUSCULAR | Status: AC
  Filled 2019-04-07: qty 2

## 2019-04-07 MED ORDER — HALOPERIDOL LACTATE 5 MG/ML IJ SOLN
10.0000 mg | Freq: Once | INTRAMUSCULAR | Status: AC
  Administered 2019-04-07: 10 mg via INTRAMUSCULAR

## 2019-04-07 MED ORDER — ZIPRASIDONE MESYLATE 20 MG IM SOLR
20.0000 mg | Freq: Once | INTRAMUSCULAR | Status: AC
  Administered 2019-04-07: 20 mg via INTRAMUSCULAR
  Filled 2019-04-07: qty 20

## 2019-04-07 MED ORDER — MIDAZOLAM HCL 2 MG/2ML IJ SOLN: 2.0000 mg | Freq: Once | INTRAMUSCULAR | Status: DC

## 2019-04-07 MED ORDER — MIDAZOLAM HCL 2 MG/2ML IJ SOLN
4.0000 mg | Freq: Once | INTRAMUSCULAR | Status: AC
  Administered 2019-04-07: 4 mg via INTRAMUSCULAR
  Filled 2019-04-07: qty 4

## 2019-04-07 MED ORDER — MIDAZOLAM HCL 2 MG/2ML IJ SOLN
4.0000 mg | Freq: Once | INTRAMUSCULAR | Status: AC
  Administered 2019-04-07: 4 mg via INTRAMUSCULAR

## 2019-04-07 MED ORDER — HALOPERIDOL LACTATE 5 MG/ML IJ SOLN
10.0000 mg | Freq: Once | INTRAMUSCULAR | Status: AC
  Administered 2019-04-07: 10 mg via INTRAMUSCULAR
  Filled 2019-04-07: qty 2

## 2019-04-07 MED ORDER — OLANZAPINE 10 MG IM SOLR
10.0000 mg | Freq: Once | INTRAMUSCULAR | Status: AC
  Administered 2019-04-07: 10 mg via INTRAMUSCULAR
  Filled 2019-04-07: qty 10

## 2019-04-07 NOTE — ED Notes (Signed)
Eating dinner

## 2019-04-07 NOTE — ED Notes (Signed)
Attempted report and was informed pt cannot come until tomorrow morning due to administration of IM meds. Pt needs to be awake and behaving. Rn informed SW at [facility].

## 2019-04-07 NOTE — ED Notes (Signed)
Stretcher removed from patient room due to pt. Attempted to break railing on stretcher

## 2019-04-07 NOTE — ED Notes (Signed)
The pt is still asleep on a mattress on the floor   According to report  The pt has not had his 1000am meds

## 2019-04-07 NOTE — ED Notes (Signed)
Pt. Asked again to sit and pt. Continues to say "im fine' when redirected to sit or lay down.

## 2019-04-07 NOTE — Progress Notes (Addendum)
Pt meets inpatient criteria. Referral information has been sent to the following hospitals for review:   [facility] Details [facility] Details [facility] Details [facility] Details [facility] Details  [facility] Details  [facility] Details [facility] Details  [facility] Details [facility] Details [facility] Details  [facility]- declined due to behavioral acuity   Disposition will continue to follow for inpatient placement needs.   Wells Guiles, LCSW, LCAS Disposition CSW [facility]/TTS [phone]

## 2019-04-07 NOTE — ED Provider Notes (Signed)
Emergency Medicine Observation Re-evaluation Note  Khup Sapia is a 24 y.o. male, seen on rounds today.  Pt initially presented to the ED for complaints of Suicidal Currently, the patient is resting, comfortably after sedation for agitation.  Physical Exam  BP 106/75 (BP Location: Right Arm)   Pulse 100   Temp (!) 97.5 F (36.4 C) (Oral)   Resp 18   Ht 6\' 3"  (1.905 m)   SpO2 96%   BMI 36.87 kg/m  Physical Exam Sleeping comfortably, no respiratory distress. ED Course / MDM  EKG:    I have reviewed the labs performed to date as well as medications administered while in observation.  Recent changes in the last 24 hours include ongoing intermittent agitation requiring IM sedation. Plan  Current plan is for continued observation and treatment pending disposition for admission.  We will ask psychiatry to help with medication management, since he is required sedation multiple times and has been confrontational with other patients. Patient is under full IVC at this time.   , MD 04/07/19 [phone]

## 2019-04-07 NOTE — ED Notes (Addendum)
Pt. Standing in doorway, kicking trash cans. Pt. Attempted to close room door. Once [employer] opened room door pt. Began to use foul language. Pt. Kicking stretcher in room and taking the mattress off of bed.

## 2019-04-07 NOTE — ED Notes (Signed)
Pt. Redirected by staff multiple times to lay down or sit down; pt. Continues to stand and seems to be unsteady at this time. Pt. Is falling asleep standing up and will not listen to staff to sit.

## 2019-04-07 NOTE — ED Notes (Signed)
Patient becomes agitated during phone call with his mother. Patient began using foul language during phone call and began pacing in main area of the pod. Patient was able to be redirected back into room

## 2019-04-07 NOTE — ED Notes (Signed)
bfast ordered 

## 2019-04-07 NOTE — ED Notes (Signed)
Pt is lying down on floor by door. Bed removed due to pt attempting to destroy. Mattress on floor. VSS.

## 2019-04-07 NOTE — BH Assessment (Signed)
**Note Logan-Identified via Obfuscation** Tele Assessment Note   Patient Name: Logan Harper MRN: [id number] Referring Physician: Marily Memos, MD Location of Patient: [facility] Location of Provider: Behavioral Health TTS Department  Logan Harper is an 24 y.o. male. Pt presents to [facility] under IVC for suicidal ideation. Per EDP, " Patient was brought in under IVC after he told behavioral health counselor that he was feeling suicidal with a plan to cut himself.  He was reportedly throwing things around in his apartment stating that he was trying to find a knife to use on himself.  He tells me over and over again, " I just want my medications. I'm feeling suicidal. I don't want to feel like this anymore. I just want my medications."  Pt presents irritable and unfocused on assessment and reluctant to give information and answer questions. Pt was asked what brought him in, he states, " I wanted to kill myself". Pt states he does not like his life but does not further explain any stressors states he just has a lot going on. Pt denies HI, AVH and recent self injurious behaviors. Pt states he does have history of cutting and that he had a plan to cut today. Pt reports 8 hours of sleep and 3 meals a day. Pt reports depressive symtoms: worthlessness, anxiety, irritability and isolation. Pt reports current provider at [facility] and does not remember what medications he is taking currently. Pt reports no access to weapons currently. Pt can not contract for safety at this time states he currently wants to hurt self " a little bit".    Per IVC: [name] called today experiencing suicidal ideation with a plan to cut self. Has been physically aggressive by throwing things in his apartment. He has been looking for a knife to cut himself in the residence has been committed before is a danger to himself.  Diagnosis: Major Depressive Disorder, Recurrent, Severe  Past Medical History:  Past Medical History:  Diagnosis Date  . Anxiety   .  Depression   . Diabetes mellitus without complication (HCC)   . Headache(784.0)   . Obesity   . Suicidal ideation     Past Surgical History:  Procedure Laterality Date  . NO PAST SURGERIES      Family History: No family history on file.  Social History:  reports that he has never smoked. He has never used smokeless tobacco. He reports that he does not drink alcohol or use drugs.  Additional Social History:  Alcohol / Drug Use Pain Medications: see MAR Prescriptions: see MAR Over the Counter: see MAR  CIWA: CIWA-Ar BP: 125/78 Pulse Rate: 92 COWS:    Allergies:  Allergies  Allergen Reactions  . Ativan [Lorazepam] Anxiety and Other (See Comments)    Paradoxical effect (hallucinations, also)  . Latuda [Lurasidone] Other (See Comments)    INEFFECTIVE    Home Medications: (Not in a hospital admission)   OB/GYN Status:  No LMP for male patient.  General Assessment Data Location of Assessment: [facility] ED TTS Assessment: In system Is this a Tele or Face-to-Face Assessment?: Tele Assessment Is this an Initial Assessment or a Re-assessment for this encounter?: Initial Assessment Patient Accompanied by:: N/A Language Other than English: No Living Arrangements: Other (Comment) What gender do you identify as?: Male Marital status: Single Pregnancy Status: No Living Arrangements: Parent Can pt return to current living arrangement?: Yes Admission Status: Voluntary Is patient capable of signing voluntary admission?: Yes Referral Source: Self/Family/Friend Insurance type: Medicaid     Crisis Care Plan  Living Arrangements: Parent Legal Guardian: Other: Name of Psychiatrist: Starling Harper Name of Therapist: Traid Harper  Education Status Is patient currently in school?: No Is the patient employed, unemployed or receiving disability?: Unemployed  Risk to self with the past 6 months Suicidal Ideation: Yes-Currently Present Has patient been a risk to self within the past  6 months prior to admission? : No Suicidal Intent: Yes-Currently Present Has patient had any suicidal intent within the past 6 months prior to admission? : No Is patient at risk for suicide?: No Suicidal Plan?: Yes-Currently Present Has patient had any suicidal plan within the past 6 months prior to admission? : No Specify Current Suicidal Plan: cut wrist Access to Means: No What has been your use of drugs/alcohol within the last 12 months?: none Previous Attempts/Gestures: Yes How many times?: 1 Other Self Harm Risks: cutter Triggers for Past Attempts: Unknown Intentional Self Injurious Behavior: Cutting Comment - Self Injurious Behavior: cut Family Suicide History: Unknown Recent stressful life event(s): Other (Comment) Persecutory voices/beliefs?: No Depression: Yes Depression Symptoms: Feeling angry/irritable, Feeling worthless/self pity Substance abuse history and/or treatment for substance abuse?: No Suicide prevention information given to non-admitted patients: Not applicable  Risk to Others within the past 6 months Homicidal Ideation: No Does patient have any lifetime risk of violence toward others beyond the six months prior to admission? : No Thoughts of Harm to Others: No Current Homicidal Intent: No Current Homicidal Plan: No Access to Homicidal Means: No Identified Victim: none History of harm to others?: No Assessment of Violence: None Noted Violent Behavior Description: none Does patient have access to weapons?: No Criminal Charges Pending?: No Does patient have a court date: No Is patient on probation?: No  Psychosis Hallucinations: None noted Delusions: None noted  Mental Status Report Appearance/Hygiene: Unremarkable Eye Contact: Fair Motor Activity: Freedom of movement Speech: Logical/coherent Level of Consciousness: Alert Mood: Irritable, Euthymic Affect: Irritable, Flat Anxiety Level: Minimal Thought Processes: Coherent Judgement:  Partial Orientation: Situation, Place, Time, Person Obsessive Compulsive Thoughts/Behaviors: None  Cognitive Functioning Concentration: Normal Memory: Recent Intact Is patient IDD: No Insight: Fair Impulse Control: Fair Appetite: Fair Have you had any weight changes? : No Change Sleep: No Change Total Hours of Sleep: 8 Vegetative Symptoms: None  ADLScreening [facility] Assessment Services) Patient's cognitive ability adequate to safely complete daily activities?: Yes Patient able to express need for assistance with ADLs?: Yes Independently performs ADLs?: Yes (appropriate for developmental age)  Prior Inpatient Therapy Prior Inpatient Therapy: Yes  Prior Outpatient Therapy Prior Outpatient Therapy: No Does patient have an ACCT team?: No Does patient have Intensive In-House Services?  : No Does patient have [facility] services? : No Does patient have P4CC services?: No  ADL Screening (condition at time of admission) Patient's cognitive ability adequate to safely complete daily activities?: Yes Is the patient deaf or have difficulty hearing?: No Does the patient have difficulty seeing, even when wearing glasses/contacts?: No Does the patient have difficulty concentrating, remembering, or making decisions?: No Patient able to express need for assistance with ADLs?: Yes Does the patient have difficulty dressing or bathing?: No Independently performs ADLs?: Yes (appropriate for developmental age) Does the patient have difficulty walking or climbing stairs?: No Weakness of Legs: None Weakness of Arms/Hands: None  Home Assistive Devices/Equipment Home Assistive Devices/Equipment: None                     Disposition: Madelin Rear, FNP recommends inpatient treatment. TTS to seek placement, no appropriate beds per  AC. TTS confirm with provider. Disposition Initial Assessment Completed for this Encounter: Yes  This service was provided via telemedicine using a 2-way,  interactive audio and video technology.  Names of all persons participating in this telemedicine service and their role in this encounter. Name: Logan Harper Role: Patient  Name: Antony Contras Role: TTS  Name:  Role:   Name: Role:     Donato Heinz 04/07/2019 1:22 AM

## 2019-04-07 NOTE — ED Notes (Signed)
Lunch tray ordered at this time.

## 2019-04-07 NOTE — ED Notes (Signed)
Pt continues aggressive stance and verbal aggression. Charge at bedside discussing with provider. 4 versed given R glut. Pt is cooperative to injection and nurse but ignoring security.

## 2019-04-07 NOTE — ED Notes (Signed)
Pt woke up wants to use the br

## 2019-04-07 NOTE — ED Notes (Signed)
Pt talking to mother on the phone and began cussing at her that the reason he is here is because of her. Pt's face is very diaphorectiv.  He was redirected back to room. Phone cord was disconnected. PT's behavior escalated and he became verbally aggressive towards pt in 52. He said "fuck this" and left room trying to elope. [employer] was able to redirect pt back to room area. Pt was verbally aggressive still to security and [employer]. Provider at bedside to assess. 10mg  Haldol given IM.

## 2019-04-07 NOTE — ED Notes (Signed)
Pt coughing occasionally otherwise still sleeping

## 2019-04-07 NOTE — Progress Notes (Signed)
Pt accepted to [facility], main campus    Dr. Loyola Mast is the accepting/attending provider.    Call report to [phone]    [facility] ED notified.     Pt is under IVC and will be transported by law enforcement    Pt may arrive to [facility] anytime today.   Wells Guiles, LCSW, LCAS Disposition CSW [facility]/TTS [phone]

## 2019-04-08 LAB — CBG MONITORING, ED: Glucose-Capillary: 113 mg/dL — ABNORMAL HIGH (ref 70–99)

## 2019-04-08 NOTE — ED Notes (Signed)
ALL Belongings - 1 labeled belongings bag - Deputy - Pt aware.

## 2019-04-08 NOTE — ED Provider Notes (Signed)
Emergency Medicine Observation Re-evaluation Note  Logan Harper is a 24 y.o. male, seen on rounds today.  Pt initially presented to the ED for complaints of Suicidal Currently, the patient is alert and cooperative.  He has no complaints at this time.  Patient has been accepted for inpatient psychiatric treatment at [facility] by Dr. Loyola Mast.  He is lying on a mattress behind the door in the room.  The staff reports that that is his area of comfort.  He was very agitated yesterday but has been cooperative this morning.  Physical Exam  BP (!) 131/97 (BP Location: Left Arm)   Pulse 85   Temp 98.1 F (36.7 C) (Oral)   Resp 19   Ht 6\' 3"  (1.905 m)   SpO2 97%   BMI 36.87 kg/m  Physical Exam Patient is alert and nontoxic.  Well-nourished well-developed. No respiratory distress. Alert.  Speech is clear.  Patient appears mildly anxious and is repetitively shaking his foot but is oriented to me and answering questions.  Does not appear acutely psychotic at this time. ED Course / MDM  EKG:EKG Interpretation  Date/Time:  Wednesday April 07 2019 01:41:30 EDT Ventricular Rate:  86 PR Interval:    QRS Duration: 113 QT Interval:  382 QTC Calculation: 457 R Axis:   53 Text Interpretation: Sinus rhythm Borderline intraventricular conduction delay Abnormal inferior Q waves When compared with ECG of 06/12/2018, No significant change was found Confirmed by 08/12/2018 (Dione Booze) on 04/07/2019 11:44:06 PM    I have reviewed the labs performed to date as well as medications administered while in observation.  Recent changes in the last 24 hours include none. Plan  Current plan is for transfer for inpatient psychiatric care. Patient is  under full IVC at this time.  Patient cleared for transfer with EMTALA form.   04/09/2019, MD 04/08/19 [phone]

## 2019-04-08 NOTE — ED Notes (Addendum)
Pt talking w/mother on phone - gave RN verbal permission to update her on tx plan. Pt and mother aware and voiced understanding- pt has been accepted to [facility].

## 2019-04-08 NOTE — ED Notes (Signed)
bfast ordered 

## 2019-04-08 NOTE — ED Notes (Signed)
Pt noted to be sleeping at this time. Pt woke earlier and looked out door of room then laid back down. Breakfast at bedside. [facility] staff member advised pt has been accepted. Deputy aware of need for transport. Advised will be here around 0900.

## 2020-03-15 ENCOUNTER — Other Ambulatory Visit: Payer: Self-pay | Admitting: Physician Assistant

## 2020-03-15 DIAGNOSIS — R7401 Elevation of levels of liver transaminase levels: Secondary | ICD-10-CM

## 2020-03-16 ENCOUNTER — Other Ambulatory Visit: Payer: Medicaid Other

## 2020-03-27 ENCOUNTER — Other Ambulatory Visit: Payer: Medicaid Other

## 2020-04-27 ENCOUNTER — Ambulatory Visit
Admission: RE | Admit: 2020-04-27 | Discharge: 2020-04-27 | Disposition: A | Discharge status: Home / Self Care | Payer: Medicaid Other | Source: Ambulatory Visit | Attending: Physician Assistant | Admitting: Physician Assistant

## 2020-04-27 ENCOUNTER — Other Ambulatory Visit: Payer: Self-pay

## 2020-04-27 DIAGNOSIS — R7401 Elevation of levels of liver transaminase levels: Secondary | ICD-10-CM
